# Patient Record
Sex: Male | Born: 1935 | Race: White | Hispanic: No | Smoking: Current every day smoker
Health system: Southern US, Community
[De-identification: ages and names within clinical notes are randomized; demographics above are authoritative.]

## PROBLEM LIST (undated history)

## (undated) DIAGNOSIS — F988 Other specified behavioral and emotional disorders with onset usually occurring in childhood and adolescence: Secondary | ICD-10-CM

## (undated) DIAGNOSIS — G629 Polyneuropathy, unspecified: Secondary | ICD-10-CM

## (undated) DIAGNOSIS — J449 Chronic obstructive pulmonary disease, unspecified: Secondary | ICD-10-CM

## (undated) DIAGNOSIS — F429 Obsessive-compulsive disorder, unspecified: Secondary | ICD-10-CM

## (undated) DIAGNOSIS — H9319 Tinnitus, unspecified ear: Secondary | ICD-10-CM

## (undated) DIAGNOSIS — H3530 Unspecified macular degeneration: Secondary | ICD-10-CM

## (undated) HISTORY — PX: APPENDECTOMY: SHX54

---

## 2002-08-18 ENCOUNTER — Ambulatory Visit (HOSPITAL_COMMUNITY): Admission: RE | Admit: 2002-08-18 | Discharge: 2002-08-18 | Payer: Self-pay | Admitting: *Deleted

## 2015-08-12 ENCOUNTER — Emergency Department (HOSPITAL_COMMUNITY): Payer: Medicare Other

## 2015-08-12 ENCOUNTER — Inpatient Hospital Stay (HOSPITAL_COMMUNITY)
Admission: EM | Admit: 2015-08-12 | Discharge: 2015-08-19 | DRG: 871 | Disposition: A | Discharge status: Skilled Nursing Facility | Payer: Medicare Other | Attending: Internal Medicine | Admitting: Internal Medicine

## 2015-08-12 ENCOUNTER — Encounter (HOSPITAL_COMMUNITY): Payer: Self-pay

## 2015-08-12 DIAGNOSIS — F988 Other specified behavioral and emotional disorders with onset usually occurring in childhood and adolescence: Secondary | ICD-10-CM | POA: Diagnosis present

## 2015-08-12 DIAGNOSIS — R0902 Hypoxemia: Secondary | ICD-10-CM

## 2015-08-12 DIAGNOSIS — E43 Unspecified severe protein-calorie malnutrition: Secondary | ICD-10-CM | POA: Diagnosis present

## 2015-08-12 DIAGNOSIS — R600 Localized edema: Secondary | ICD-10-CM | POA: Diagnosis not present

## 2015-08-12 DIAGNOSIS — N390 Urinary tract infection, site not specified: Secondary | ICD-10-CM | POA: Diagnosis present

## 2015-08-12 DIAGNOSIS — W19XXXA Unspecified fall, initial encounter: Secondary | ICD-10-CM | POA: Diagnosis present

## 2015-08-12 DIAGNOSIS — I5031 Acute diastolic (congestive) heart failure: Secondary | ICD-10-CM | POA: Diagnosis present

## 2015-08-12 DIAGNOSIS — F17210 Nicotine dependence, cigarettes, uncomplicated: Secondary | ICD-10-CM | POA: Diagnosis present

## 2015-08-12 DIAGNOSIS — M6282 Rhabdomyolysis: Secondary | ICD-10-CM | POA: Diagnosis present

## 2015-08-12 DIAGNOSIS — Z66 Do not resuscitate: Secondary | ICD-10-CM | POA: Diagnosis not present

## 2015-08-12 DIAGNOSIS — R740 Nonspecific elevation of levels of transaminase and lactic acid dehydrogenase [LDH]: Secondary | ICD-10-CM | POA: Diagnosis present

## 2015-08-12 DIAGNOSIS — E860 Dehydration: Secondary | ICD-10-CM | POA: Diagnosis present

## 2015-08-12 DIAGNOSIS — F429 Obsessive-compulsive disorder, unspecified: Secondary | ICD-10-CM | POA: Diagnosis present

## 2015-08-12 DIAGNOSIS — R531 Weakness: Secondary | ICD-10-CM

## 2015-08-12 DIAGNOSIS — D696 Thrombocytopenia, unspecified: Secondary | ICD-10-CM | POA: Diagnosis present

## 2015-08-12 DIAGNOSIS — J9602 Acute respiratory failure with hypercapnia: Secondary | ICD-10-CM | POA: Diagnosis not present

## 2015-08-12 DIAGNOSIS — G629 Polyneuropathy, unspecified: Secondary | ICD-10-CM | POA: Diagnosis present

## 2015-08-12 DIAGNOSIS — R748 Abnormal levels of other serum enzymes: Secondary | ICD-10-CM

## 2015-08-12 DIAGNOSIS — N179 Acute kidney failure, unspecified: Secondary | ICD-10-CM | POA: Insufficient documentation

## 2015-08-12 DIAGNOSIS — R911 Solitary pulmonary nodule: Secondary | ICD-10-CM | POA: Diagnosis present

## 2015-08-12 DIAGNOSIS — Z6820 Body mass index (BMI) 20.0-20.9, adult: Secondary | ICD-10-CM | POA: Diagnosis not present

## 2015-08-12 DIAGNOSIS — E872 Acidosis, unspecified: Secondary | ICD-10-CM

## 2015-08-12 DIAGNOSIS — J449 Chronic obstructive pulmonary disease, unspecified: Secondary | ICD-10-CM | POA: Diagnosis present

## 2015-08-12 DIAGNOSIS — R7989 Other specified abnormal findings of blood chemistry: Secondary | ICD-10-CM | POA: Diagnosis present

## 2015-08-12 DIAGNOSIS — H3530 Unspecified macular degeneration: Secondary | ICD-10-CM | POA: Diagnosis present

## 2015-08-12 DIAGNOSIS — R0602 Shortness of breath: Secondary | ICD-10-CM

## 2015-08-12 DIAGNOSIS — Z515 Encounter for palliative care: Secondary | ICD-10-CM | POA: Diagnosis not present

## 2015-08-12 DIAGNOSIS — R000 Tachycardia, unspecified: Secondary | ICD-10-CM | POA: Diagnosis present

## 2015-08-12 DIAGNOSIS — A419 Sepsis, unspecified organism: Secondary | ICD-10-CM | POA: Diagnosis present

## 2015-08-12 DIAGNOSIS — L89222 Pressure ulcer of left hip, stage 2: Secondary | ICD-10-CM | POA: Diagnosis present

## 2015-08-12 DIAGNOSIS — N289 Disorder of kidney and ureter, unspecified: Secondary | ICD-10-CM

## 2015-08-12 DIAGNOSIS — E8809 Other disorders of plasma-protein metabolism, not elsewhere classified: Secondary | ICD-10-CM | POA: Diagnosis present

## 2015-08-12 DIAGNOSIS — R627 Adult failure to thrive: Secondary | ICD-10-CM | POA: Diagnosis present

## 2015-08-12 DIAGNOSIS — L8990 Pressure ulcer of unspecified site, unspecified stage: Secondary | ICD-10-CM | POA: Diagnosis present

## 2015-08-12 DIAGNOSIS — R64 Cachexia: Secondary | ICD-10-CM | POA: Diagnosis present

## 2015-08-12 HISTORY — DX: Polyneuropathy, unspecified: G62.9

## 2015-08-12 HISTORY — DX: Chronic obstructive pulmonary disease, unspecified: J44.9

## 2015-08-12 HISTORY — DX: Unspecified macular degeneration: H35.30

## 2015-08-12 HISTORY — DX: Obsessive-compulsive disorder, unspecified: F42.9

## 2015-08-12 HISTORY — DX: Other specified behavioral and emotional disorders with onset usually occurring in childhood and adolescence: F98.8

## 2015-08-12 HISTORY — DX: Tinnitus, unspecified ear: H93.19

## 2015-08-12 LAB — CBC WITH DIFFERENTIAL/PLATELET
Basophils Absolute: 0 10*3/uL (ref 0.0–0.1)
Basophils Relative: 0 %
EOS PCT: 0 %
Eosinophils Absolute: 0 10*3/uL (ref 0.0–0.7)
HCT: 37.5 % — ABNORMAL LOW (ref 39.0–52.0)
Hemoglobin: 12.8 g/dL — ABNORMAL LOW (ref 13.0–17.0)
LYMPHS ABS: 0.7 10*3/uL (ref 0.7–4.0)
LYMPHS PCT: 3 %
MCH: 35.2 pg — AB (ref 26.0–34.0)
MCHC: 34.1 g/dL (ref 30.0–36.0)
MCV: 103 fL — AB (ref 78.0–100.0)
MONO ABS: 1.6 10*3/uL — AB (ref 0.1–1.0)
MONOS PCT: 7 %
Neutro Abs: 19.4 10*3/uL — ABNORMAL HIGH (ref 1.7–7.7)
Neutrophils Relative %: 89 %
PLATELETS: 81 10*3/uL — AB (ref 150–400)
RBC: 3.64 MIL/uL — ABNORMAL LOW (ref 4.22–5.81)
RDW: 14 % (ref 11.5–15.5)
WBC: 21.7 10*3/uL — ABNORMAL HIGH (ref 4.0–10.5)

## 2015-08-12 LAB — I-STAT CG4 LACTIC ACID, ED
LACTIC ACID, VENOUS: 3.28 mmol/L — AB (ref 0.5–2.0)
LACTIC ACID, VENOUS: 2.51 mmol/L — AB (ref 0.5–2.0)

## 2015-08-12 LAB — COMPREHENSIVE METABOLIC PANEL
ALBUMIN: 2.3 g/dL — AB (ref 3.5–5.0)
ALT: 59 U/L (ref 17–63)
AST: 232 U/L — AB (ref 15–41)
Alkaline Phosphatase: 76 U/L (ref 38–126)
Anion gap: 12 (ref 5–15)
BILIRUBIN TOTAL: 1 mg/dL (ref 0.3–1.2)
BUN: 29 mg/dL — AB (ref 6–20)
CHLORIDE: 104 mmol/L (ref 101–111)
CO2: 26 mmol/L (ref 22–32)
Calcium: 8.3 mg/dL — ABNORMAL LOW (ref 8.9–10.3)
Creatinine, Ser: 1.82 mg/dL — ABNORMAL HIGH (ref 0.61–1.24)
GFR calc Af Amer: 38 mL/min — ABNORMAL LOW (ref >60)
GFR calc non Af Amer: 33 mL/min — ABNORMAL LOW (ref >60)
GLUCOSE: 84 mg/dL (ref 65–99)
POTASSIUM: 4.4 mmol/L (ref 3.5–5.1)
Sodium: 142 mmol/L (ref 135–145)
Total Protein: 6.6 g/dL (ref 6.5–8.1)

## 2015-08-12 LAB — I-STAT TROPONIN, ED: Troponin i, poc: 0.07 ng/mL (ref 0.00–0.08)

## 2015-08-12 LAB — PROTIME-INR
INR: 1.34 (ref 0.00–1.49)
PROTHROMBIN TIME: 16.7 s — AB (ref 11.6–15.2)

## 2015-08-12 LAB — MAGNESIUM: Magnesium: 2.2 mg/dL (ref 1.7–2.4)

## 2015-08-12 LAB — CK: Total CK: 3801 U/L — ABNORMAL HIGH (ref 49–397)

## 2015-08-12 MED ORDER — SODIUM CHLORIDE 0.9 % IV SOLN
INTRAVENOUS | Status: DC
  Administered 2015-08-12 – 2015-08-15 (×5): via INTRAVENOUS

## 2015-08-12 MED ORDER — IPRATROPIUM-ALBUTEROL 0.5-2.5 (3) MG/3ML IN SOLN
3.0000 mL | Freq: Once | RESPIRATORY_TRACT | Status: AC
  Administered 2015-08-12: 3 mL via RESPIRATORY_TRACT
  Filled 2015-08-12: qty 3

## 2015-08-12 MED ORDER — HEPARIN SODIUM (PORCINE) 5000 UNIT/ML IJ SOLN
5000.0000 [IU] | Freq: Three times a day (TID) | INTRAMUSCULAR | Status: DC
  Administered 2015-08-12 – 2015-08-18 (×17): 5000 [IU] via SUBCUTANEOUS
  Filled 2015-08-12 (×19): qty 1

## 2015-08-12 MED ORDER — ACETAMINOPHEN 325 MG PO TABS
650.0000 mg | ORAL_TABLET | Freq: Four times a day (QID) | ORAL | Status: DC | PRN
  Administered 2015-08-13 – 2015-08-14 (×2): 650 mg via ORAL
  Filled 2015-08-12 (×2): qty 2

## 2015-08-12 MED ORDER — SODIUM CHLORIDE 0.9 % IV BOLUS (SEPSIS)
1000.0000 mL | Freq: Once | INTRAVENOUS | Status: AC
  Administered 2015-08-12: 1000 mL via INTRAVENOUS

## 2015-08-12 MED ORDER — ONDANSETRON HCL 4 MG PO TABS: 4.0000 mg | ORAL_TABLET | Freq: Four times a day (QID) | ORAL | Status: DC | PRN

## 2015-08-12 MED ORDER — SODIUM CHLORIDE 0.9% FLUSH
3.0000 mL | Freq: Two times a day (BID) | INTRAVENOUS | Status: DC
  Administered 2015-08-12 – 2015-08-19 (×9): 3 mL via INTRAVENOUS

## 2015-08-12 MED ORDER — VANCOMYCIN HCL 500 MG IV SOLR: 500.0000 mg | INTRAVENOUS | Status: DC

## 2015-08-12 MED ORDER — ONDANSETRON HCL 4 MG/2ML IJ SOLN: 4.0000 mg | Freq: Four times a day (QID) | INTRAMUSCULAR | Status: DC | PRN

## 2015-08-12 MED ORDER — PIPERACILLIN-TAZOBACTAM 3.375 G IVPB: 3.3750 g | Freq: Three times a day (TID) | INTRAVENOUS | Status: DC

## 2015-08-12 MED ORDER — ACETAMINOPHEN 650 MG RE SUPP: 650.0000 mg | Freq: Four times a day (QID) | RECTAL | Status: DC | PRN

## 2015-08-12 MED ORDER — PIPERACILLIN-TAZOBACTAM 3.375 G IVPB 30 MIN
3.3750 g | Freq: Once | INTRAVENOUS | Status: AC
  Administered 2015-08-12: 3.375 g via INTRAVENOUS
  Filled 2015-08-12: qty 50

## 2015-08-12 MED ORDER — MORPHINE SULFATE (PF) 2 MG/ML IV SOLN: 1.0000 mg | INTRAVENOUS | Status: DC | PRN

## 2015-08-12 MED ORDER — VANCOMYCIN HCL IN DEXTROSE 1-5 GM/200ML-% IV SOLN
1000.0000 mg | Freq: Once | INTRAVENOUS | Status: AC
  Administered 2015-08-12: 1000 mg via INTRAVENOUS
  Filled 2015-08-12: qty 200

## 2015-08-12 MED ORDER — HYDROCODONE-ACETAMINOPHEN 5-325 MG PO TABS: 1.0000 | ORAL_TABLET | ORAL | Status: DC | PRN

## 2015-08-12 NOTE — ED Notes (Signed)
Patient refused in and out cath. MD aware.

## 2015-08-12 NOTE — ED Notes (Signed)
Patient aware that urine sample is needed. Patient states he is unable to void at this time

## 2015-08-12 NOTE — ED Notes (Signed)
Patient given ginger ale and cran grape juices

## 2015-08-12 NOTE — Progress Notes (Signed)
CSW attempted to speak with patient. EDP was in at that time.  Elenore PaddyLaVonia Namiko Pritts, LCSWA [phone] ED CSW 08/12/2015 1:54 PM

## 2015-08-12 NOTE — ED Notes (Signed)
Pt arrived EMS incontinent of urine and stool. Pt clean and changed into brief

## 2015-08-12 NOTE — Progress Notes (Signed)
Pharmacy Antibiotic Note  Leodis BinetRalf E Bryngelson is a 82 y.o. male admitted on 08/12/2015 with sepsis.  Pharmacy has been consulted for vancomycin/Zosyn dosing.   Patient lives at home alone and states that about 3 days ago he was trying to get out of his chair and fell onto the ground. He states that he was unable to get up. Family states that they had not heard from him over this period of time and subsequently called the police who then found patient lying on the floor.   Plan: Zosyn 3.375g IV q8h (4 hour infusion).  Vancomycin 1000 mg IV x1, then 500 mg IV q24h.  Height: 5\' 7"  (170.2 cm) Weight: 130 lb (58.968 kg) IBW/kg (Calculated) : 66.1  Temp (24hrs), Avg:97.4 F (36.3 C), Min:97.4 F (36.3 C), Max:97.4 F (36.3 C)   Recent Labs Lab 08/12/15 1322 08/12/15 1334  WBC 21.7*  --   CREATININE 1.82*  --   LATICACIDVEN  --  3.28*    Estimated Creatinine Clearance: 26.1 mL/min (by C-G formula based on Cr of 1.82).    No Known Allergies  Antimicrobials this admission: ----  Dose adjustments this admission: ----  Microbiology results: ----  Thank you for allowing pharmacy to be a part of this patient's care.   Adalberto ColeNikola Troi Florendo, PharmD, BCPS Pager [phone] 08/12/2015 3:44 PM

## 2015-08-12 NOTE — H&P (Signed)
[facility] History and Physical  KERN GINGRAS ZOX:[id number] DOB: 12/16/1931 DOA: 08/12/2015  Referring physician: Verdie Mosher PCP: No primary care provider on file.   Chief Complaint: Fall  HPI: Jordan Santiago is a 82 y.o. male with past medical history of COPD and ADD, brought to the hospital after a fall. Patient reported for the past year he had lower extremity edema, but recently he developed generalized weakness and his overall health was going downhill. He reported on Saturday (his daughter mentioned that was just 2 days ago) he slid out of his chair and he remained on the floor until she tried to call him earlier today, he did not call back so she stopped by and he was found to be on the floor, covered by his waste. He mentioned to his daughter that he couldn't get up after he fell. In the ED his creatinine was found to be at 1.82, lactic acid 3.28, WBC 21.7 and platelets 81. Chest x-ray without acute findings, his urinalysis pending, patient refused in and out catheterization.  Review of Systems:  Constitutional: Generalized weakness Eyes: negative for irritation, redness and visual disturbance Ears, nose, mouth, throat, and face: negative for earaches, epistaxis, nasal congestion and sore throat Respiratory: negative for cough, dyspnea on exertion, sputum and wheezing Cardiovascular: negative for chest pain, dyspnea, lower extremity edema, orthopnea, palpitations and syncope Gastrointestinal: negative for abdominal pain, constipation, diarrhea, melena, nausea and vomiting Genitourinary:negative for dysuria, frequency and hematuria Hematologic/lymphatic: negative for bleeding, easy bruising and lymphadenopathy Musculoskeletal:negative for arthralgias, muscle weakness and stiff joints Neurological: negative for coordination problems, gait problems, headaches and weakness Endocrine: negative for diabetic symptoms including polydipsia, polyuria and weight  loss Allergic/Immunologic: negative for anaphylaxis, hay fever and urticaria  Past Medical History  Diagnosis Date  . AMD (age related macular degeneration)   . COPD (chronic obstructive pulmonary disease) (HCC)   . Peripheral neuropathy (HCC)   . Tinnitus   . ADD (attention deficit disorder)   . OCD (obsessive compulsive disorder)    Past Surgical History  Procedure Laterality Date  . Appendectomy     Social History:   reports that he has been smoking Cigarettes.  He has been smoking about 0.50 packs per day. He does not have any smokeless tobacco history on file. He reports that he drinks alcohol. He reports that he does not use illicit drugs.  No Known Allergies  Family history No pertinent family history  Prior to Admission medications   Medication Sig Start Date End Date Taking? Authorizing Provider  Multiple Vitamins-Minerals (B COMPLEX PLUS VITAMIN C PO) Take 1 tablet by mouth daily.   Yes Historical Provider, MD  Multiple Vitamins-Minerals (OCUVITE PRESERVISION PO) Take 2 tablets by mouth daily.   Yes Historical Provider, MD   Physical Exam: Filed Vitals:   08/12/15 1405 08/12/15 1430  BP: 130/77 107/80  Pulse: 117 110  Temp:    Resp: 18 21   Constitutional: Oriented to person, place, and time. Well-developed and well-nourished. Cooperative.  Head: Normocephalic and atraumatic.  Nose: Nose normal.  Mouth/Throat: Uvula is midline, oropharynx is clear and moist and mucous membranes are normal.  Eyes: Conjunctivae and EOM are normal. Pupils are equal, round, and reactive to light.  Neck: Trachea normal and normal range of motion. Neck supple.  Cardiovascular: Normal rate, regular rhythm, S1 normal, S2 normal, normal heart sounds and intact distal pulses.   Pulmonary/Chest: Effort normal and breath sounds normal.  Abdominal: Soft. Bowel sounds are normal. There is no  hepatosplenomegaly. There is no tenderness.  Musculoskeletal: Normal range of motion.  Neurological:  Alert and oriented to person, place, and time. Has normal strength. No cranial nerve deficit or sensory deficit.  Skin: Skin is warm, dry and intact.  Psychiatric: Has a normal mood and affect. Speech is normal and behavior is normal.   Labs on Admission:  Basic Metabolic Panel:  Recent Labs Lab 08/12/15 1322 08/12/15 1325  NA 142  --   K 4.4  --   CL 104  --   CO2 26  --   GLUCOSE 84  --   BUN 29*  --   CREATININE 1.82*  --   CALCIUM 8.3*  --   MG  --  2.2   Liver Function Tests:  Recent Labs Lab 08/12/15 1322  AST 232*  ALT 59  ALKPHOS 76  BILITOT 1.0  PROT 6.6  ALBUMIN 2.3*   No results for input(s): LIPASE, AMYLASE in the last 168 hours. No results for input(s): AMMONIA in the last 168 hours. CBC:  Recent Labs Lab 08/12/15 1322  WBC 21.7*  NEUTROABS 19.4*  HGB 12.8*  HCT 37.5*  MCV 103.0*  PLT 81*   Cardiac Enzymes:  Recent Labs Lab 08/12/15 1322  CKTOTAL 3801*    BNP (last 3 results) No results for input(s): BNP in the last 8760 hours.  ProBNP (last 3 results) No results for input(s): PROBNP in the last 8760 hours.  CBG: No results for input(s): GLUCAP in the last 168 hours.  Radiological Exams on Admission: Dg Chest 2 View  08/12/2015  CLINICAL DATA:  Weakness and fall. EXAM: CHEST  2 VIEW COMPARISON:  None. FINDINGS: COPD with emphysematous changes and hyperinflation. There is trace pleural fluid bilaterally. Nodular opacity of the right mid lung measuring ~20 mm. Nipple shadow over the left base. There is no edema, consolidation, or pneumothorax. Normal heart size and mediastinal contours. IMPRESSION: 1. Emphysema with right-sided nodule. Recommend chest CT, contrast not essential, when clinically appropriate. 2. Trace pleural effusions. Electronically Signed   By: Marnee SpringJonathon  Watts M.D.   On: 08/12/2015 13:52   Ct Head Wo Contrast  08/12/2015  CLINICAL DATA:  82 year old post fall three days ago while getting out of chair. EXAM: CT HEAD  WITHOUT CONTRAST CT CERVICAL SPINE WITHOUT CONTRAST TECHNIQUE: Multidetector CT imaging of the head and cervical spine was performed following the standard protocol without intravenous contrast. Multiplanar CT image reconstructions of the cervical spine were also generated. COMPARISON:  None. FINDINGS: CT HEAD FINDINGS Ventricles and cisterns are within normal. There is minimal prominence of the CSF spaces compatible with age related atrophy. There is chronic ischemic microvascular disease. There is no mass, mass effect, shift of midline structures or acute hemorrhage. There is no evidence to suggest acute infarction. Remaining bones and soft tissues are within normal. CT CERVICAL SPINE FINDINGS There is straightening of the normal cervical lordosis with subtle 1-2 mm anterior subluxation of C3 on C4 likely degenerative. Mild to moderate spondylosis of the cervical spine. There is moderate disc space narrowing at the C5-6 and C6-7 levels and to lesser extent at the C4-5 level. Prevertebral soft tissues are within normal. There is moderate uncovertebral joint spurring and mild facet arthropathy. The atlantoaxial articulation is within normal mild neural foraminal narrowing at several levels of the mid to lower cervical spine. No acute fracture or traumatic subluxation. Emphysematous disease over the lung apices. Remainder of the exam is within normal. IMPRESSION: No acute intracranial findings per Mild  atrophy and chronic ischemic microvascular disease. No acute cervical spine injury. Mild to moderate spondylosis of the cervical spinal multilevel disc disease from the C4-5 level to the C6-7 level. Mild multilevel neural foraminal narrowing. Emphysematous disease. Electronically Signed   By: Elberta Fortis M.D.   On: 08/12/2015 14:16   Ct Cervical Spine Wo Contrast  08/12/2015  CLINICAL DATA:  82 year old post fall three days ago while getting out of chair. EXAM: CT HEAD WITHOUT CONTRAST CT CERVICAL SPINE WITHOUT  CONTRAST TECHNIQUE: Multidetector CT imaging of the head and cervical spine was performed following the standard protocol without intravenous contrast. Multiplanar CT image reconstructions of the cervical spine were also generated. COMPARISON:  None. FINDINGS: CT HEAD FINDINGS Ventricles and cisterns are within normal. There is minimal prominence of the CSF spaces compatible with age related atrophy. There is chronic ischemic microvascular disease. There is no mass, mass effect, shift of midline structures or acute hemorrhage. There is no evidence to suggest acute infarction. Remaining bones and soft tissues are within normal. CT CERVICAL SPINE FINDINGS There is straightening of the normal cervical lordosis with subtle 1-2 mm anterior subluxation of C3 on C4 likely degenerative. Mild to moderate spondylosis of the cervical spine. There is moderate disc space narrowing at the C5-6 and C6-7 levels and to lesser extent at the C4-5 level. Prevertebral soft tissues are within normal. There is moderate uncovertebral joint spurring and mild facet arthropathy. The atlantoaxial articulation is within normal mild neural foraminal narrowing at several levels of the mid to lower cervical spine. No acute fracture or traumatic subluxation. Emphysematous disease over the lung apices. Remainder of the exam is within normal. IMPRESSION: No acute intracranial findings per Mild atrophy and chronic ischemic microvascular disease. No acute cervical spine injury. Mild to moderate spondylosis of the cervical spinal multilevel disc disease from the C4-5 level to the C6-7 level. Mild multilevel neural foraminal narrowing. Emphysematous disease. Electronically Signed   By: Elberta Fortis M.D.   On: 08/12/2015 14:16    EKG: Independently reviewed.   Assessment/Plan Principal Problem:   Fall Active Problems:   Rhabdomyolysis   Renal insufficiency   Bilateral lower extremity edema   Leukocytosis   Elevated lactic acid level    Thrombocytopenia (HCC)   Generalized weakness    Fall Patient fell a couple of days ago and was not able to stand up and been on the floor since then. Generalized weakness, no evidence of trauma or fractures. Evidence of rhabdomyolysis likely from being on the floor. Patient was awake and alert all the time no evidence of loss of consciousness. Patient will be on telemetry, hydrate patient with IV fluids.  Rhabdomyolysis Mild rhabdomyolysis with CK of 3800. Patient has acute kidney injury not sure if it's acute or secondary to the rhabdomyolysis. Hydrate with IV fluids, check CK in the morning.  Renal sufficiency Creatinine is 1.82, unknown baseline. As mentioned above unclear to me of this is acute or chronic renal insufficiency, hydrate with IV fluids and check BMP in a.m.  Elevated lactic acid This is likely secondary to dehydration is no evidence of infection so far, no fever. Leukocytosis is likely secondary to stress demargination.  Thrombocytopenia Asymptomatic, follow  Bilateral lower extremity edema With chronic skin changes, mentioned he has edema for the past year. No history of CKD or CHF, we'll hydrate carefully.  Code Status: Full code Family Communication: Plan discussed with the patient in the presence of his daughter at bedside. Disposition Plan: Inpatient  Time spent:  70 minutes  Clint Lipps, MD [facility] Pager [phone]

## 2015-08-12 NOTE — ED Notes (Addendum)
Per EMS, police were called by family because they had not heard from the patient in a 3 days. Patient was found laying on the floor beside the bed, unable to recall how long he had been laying there. Skin tear to left elbow Upon arrival, patient had a slight wheeze. Patient given 5 mg of albuterol. Patient's lungs are now clear. Patient complaining generalized lower back pain. Stage 1-2 pressure ulcer to left hip area. Patient is from home and per EMS, patient's living conditions are not well.

## 2015-08-12 NOTE — ED Notes (Signed)
Pt reports that he slid out of a chair x 3 days ago.  Sts "I slid out of the chair.  I have a bony butt and the seat ejected me.  I don't know why I couldn't get up.  I don't know if it was physical or psychological."  Denies new pain.

## 2015-08-12 NOTE — ED Notes (Signed)
Patient transported to CT and X ray 

## 2015-08-12 NOTE — Progress Notes (Signed)
CSW spoke with patient's daughter, Earlean ShawlLee Ennis, daughter to obtain information. Patient was speaking with Pharmacy Tech at the time. Patient's daughter reports she went to see patient today. She reports she could hear him, however, she could not get the door open. She reports she went to the office at the complex where patient resides to see if they would allow her in his place. She reports she is not on patient's lease, therefore, they could not allow her in his place. She reports she called the police and they found patient on the floor. She reports patient is very weak, however, she states no history of falls.   Patient's daughter reports patient does live alone and he has a cane. She stated she did not want to see patient go into a skilled nursing facility. She stated she would like for him to go into an assisted living facility. She stated she [place] in [place], [place] and she would like for patient to be placed at a facility in [place] area, such [place]: [place]. CSW informed patient's daughter that she could be provided resources for this area to assist her with placement.   Patient's daughter reports that the patient only has Medicare. CSW informed [place] [facility] [place] [facility] [place] to speak with a Medicaid worker to begin the application process for Medicaid. Patient is a disable veteran and he states he receives $1496 monthly. No further questions noted at this time.   Earlean ShawlLee Ennis, daughter [phone]

## 2015-08-12 NOTE — ED Provider Notes (Signed)
CSN: [id number]     Arrival date & time 08/12/15  1223 History   First MD Initiated Contact with Patient 08/12/15 1248     Chief Complaint  Patient presents with  . Fall     (Consider location/radiation/quality/duration/timing/severity/associated sxs/prior Treatment) HPI 82 year old male who presents after fall. History of COPD. History provided by patient and his daughter. Patient lives at home alone and states that about 3 days ago he was trying to get out of his chair and fell onto the ground. He states that he was unable to get up. Family states that they had not heard from him over this period of time and subsequently called the police who then found patient lying on the floor. Patient currently denying any pain. Denies headache, chest pain, difficulty breathing, abdominal pain. He does not think he has recently been sick but does state that he has been generally weak. Past Medical History  Diagnosis Date  . AMD (age related macular degeneration)   . COPD (chronic obstructive pulmonary disease) (HCC)   . Peripheral neuropathy (HCC)   . Tinnitus   . ADD (attention deficit disorder)   . OCD (obsessive compulsive disorder)    Past Surgical History  Procedure Laterality Date  . Appendectomy     History reviewed. No pertinent family history. Social History  Substance Use Topics  . Smoking status: Current Every Day Smoker -- 0.50 packs/day    Types: Cigarettes  . Smokeless tobacco: None  . Alcohol Use: Yes    Review of Systems 10/14 systems reviewed and are negative other than those stated in the HPI   Allergies  Review of patient's allergies indicates no known allergies.  Home Medications   Prior to Admission medications   Medication Sig Start Date End Date Taking? Authorizing Provider  Multiple Vitamins-Minerals (B COMPLEX PLUS VITAMIN C PO) Take 1 tablet by mouth daily.   Yes Historical Provider, MD  Multiple Vitamins-Minerals (OCUVITE PRESERVISION PO) Take 2 tablets  by mouth daily.   Yes Historical Provider, MD   BP 107/80 mmHg  Pulse 110  Temp(Src) 97.4 F (36.3 C) (Oral)  Resp 21  Ht 5\' 7"  (1.702 m)  Wt 130 lb (58.968 kg)  BMI 20.36 kg/m2  SpO2 90% Physical Exam Physical Exam  Nursing note and vitals reviewed. Constitutional: Cachectic appearing elderly gentleman, non-toxic, and in no acute distress Head: Normocephalic and atraumatic.  Mouth/Throat: Oropharynx is clear. Poor dentition with dry mucous membranes Neck: Normal range of motion. Neck supple.  Cardiovascular: Tachycardic rate and regular rhythm.  +2 pitting edema. Pulmonary/Chest: Effort normal. Poor air movement but no appreciable wheezes, rales or rhonchi. Abdominal: Soft. There is no tenderness. There is no rebound and no guarding. no flank tenderness Musculoskeletal: Normal range of motion. No CTLS spine tenderness. Neurological: Alert, no facial droop, fluent speech, moves all extremities symmetrically Skin: Skin is warm and dry. Poor skin turgor Psychiatric: Cooperative  ED Course  Procedures (including critical care time) Labs Review Labs Reviewed  COMPREHENSIVE METABOLIC PANEL - Abnormal; Notable for the following:    BUN 29 (*)    Creatinine, Ser 1.82 (*)    Calcium 8.3 (*)    Albumin 2.3 (*)    AST 232 (*)    GFR calc non Af Amer 33 (*)    GFR calc Af Amer 38 (*)    All other components within normal limits  CBC WITH DIFFERENTIAL/PLATELET - Abnormal; Notable for the following:    WBC 21.7 (*)    RBC  3.64 (*)    Hemoglobin 12.8 (*)    HCT 37.5 (*)    MCV 103.0 (*)    MCH 35.2 (*)    Platelets 81 (*)    Neutro Abs 19.4 (*)    Monocytes Absolute 1.6 (*)    All other components within normal limits  CK - Abnormal; Notable for the following:    Total CK 3801 (*)    All other components within normal limits  I-STAT CG4 LACTIC ACID, ED - Abnormal; Notable for the following:    Lactic Acid, Venous 3.28 (*)    All other components within normal limits  I-STAT  CG4 LACTIC ACID, ED - Abnormal; Notable for the following:    Lactic Acid, Venous 2.51 (*)    All other components within normal limits  URINE CULTURE  CULTURE, BLOOD (ROUTINE X 2)  CULTURE, BLOOD (ROUTINE X 2)  MAGNESIUM  URINALYSIS, ROUTINE W REFLEX MICROSCOPIC (NOT AT [facility])  Rosezena SensorI-STAT TROPOININ, ED    Imaging Review Dg Chest 2 View  08/12/2015  CLINICAL DATA:  Weakness and fall. EXAM: CHEST  2 VIEW COMPARISON:  None. FINDINGS: COPD with emphysematous changes and hyperinflation. There is trace pleural fluid bilaterally. Nodular opacity of the right mid lung measuring ~20 mm. Nipple shadow over the left base. There is no edema, consolidation, or pneumothorax. Normal heart size and mediastinal contours. IMPRESSION: 1. Emphysema with right-sided nodule. Recommend chest CT, contrast not essential, when clinically appropriate. 2. Trace pleural effusions. Electronically Signed   By: Marnee SpringJonathon  Watts M.D.   On: 08/12/2015 13:52   Ct Head Wo Contrast  08/12/2015  CLINICAL DATA:  82 year old post fall three days ago while getting out of chair. EXAM: CT HEAD WITHOUT CONTRAST CT CERVICAL SPINE WITHOUT CONTRAST TECHNIQUE: Multidetector CT imaging of the head and cervical spine was performed following the standard protocol without intravenous contrast. Multiplanar CT image reconstructions of the cervical spine were also generated. COMPARISON:  None. FINDINGS: CT HEAD FINDINGS Ventricles and cisterns are within normal. There is minimal prominence of the CSF spaces compatible with age related atrophy. There is chronic ischemic microvascular disease. There is no mass, mass effect, shift of midline structures or acute hemorrhage. There is no evidence to suggest acute infarction. Remaining bones and soft tissues are within normal. CT CERVICAL SPINE FINDINGS There is straightening of the normal cervical lordosis with subtle 1-2 mm anterior subluxation of C3 on C4 likely degenerative. Mild to moderate spondylosis of the  cervical spine. There is moderate disc space narrowing at the C5-6 and C6-7 levels and to lesser extent at the C4-5 level. Prevertebral soft tissues are within normal. There is moderate uncovertebral joint spurring and mild facet arthropathy. The atlantoaxial articulation is within normal mild neural foraminal narrowing at several levels of the mid to lower cervical spine. No acute fracture or traumatic subluxation. Emphysematous disease over the lung apices. Remainder of the exam is within normal. IMPRESSION: No acute intracranial findings per Mild atrophy and chronic ischemic microvascular disease. No acute cervical spine injury. Mild to moderate spondylosis of the cervical spinal multilevel disc disease from the C4-5 level to the C6-7 level. Mild multilevel neural foraminal narrowing. Emphysematous disease. Electronically Signed   By: Elberta Fortisaniel  Boyle M.D.   On: 08/12/2015 14:16   Ct Cervical Spine Wo Contrast  08/12/2015  CLINICAL DATA:  82 year old post fall three days ago while getting out of chair. EXAM: CT HEAD WITHOUT CONTRAST CT CERVICAL SPINE WITHOUT CONTRAST TECHNIQUE: Multidetector CT imaging of the head  and cervical spine was performed following the standard protocol without intravenous contrast. Multiplanar CT image reconstructions of the cervical spine were also generated. COMPARISON:  None. FINDINGS: CT HEAD FINDINGS Ventricles and cisterns are within normal. There is minimal prominence of the CSF spaces compatible with age related atrophy. There is chronic ischemic microvascular disease. There is no mass, mass effect, shift of midline structures or acute hemorrhage. There is no evidence to suggest acute infarction. Remaining bones and soft tissues are within normal. CT CERVICAL SPINE FINDINGS There is straightening of the normal cervical lordosis with subtle 1-2 mm anterior subluxation of C3 on C4 likely degenerative. Mild to moderate spondylosis of the cervical spine. There is moderate disc space  narrowing at the C5-6 and C6-7 levels and to lesser extent at the C4-5 level. Prevertebral soft tissues are within normal. There is moderate uncovertebral joint spurring and mild facet arthropathy. The atlantoaxial articulation is within normal mild neural foraminal narrowing at several levels of the mid to lower cervical spine. No acute fracture or traumatic subluxation. Emphysematous disease over the lung apices. Remainder of the exam is within normal. IMPRESSION: No acute intracranial findings per Mild atrophy and chronic ischemic microvascular disease. No acute cervical spine injury. Mild to moderate spondylosis of the cervical spinal multilevel disc disease from the C4-5 level to the C6-7 level. Mild multilevel neural foraminal narrowing. Emphysematous disease. Electronically Signed   By: Elberta Fortis M.D.   On: 08/12/2015 14:16   I have personally reviewed and evaluated these images and lab results as part of my medical decision-making.   EKG Interpretation   Date/Time:  Thursday August 12 2015 13:09:48 EDT Ventricular Rate:  120 PR Interval:    QRS Duration: 77 QT Interval:  399 QTC Calculation: 564 R Axis:   -81 Text Interpretation:  Atrial fibrillation Ventricular premature complex  Consider inferior infarct Anterolateral infarct, age indeterminate  Prolonged QT interval Baseline wander in lead(s) V3 Confirmed by LIU MD,  DANA [phone]) on 08/12/2015 3:10:06 PM     CRITICAL CARE Performed by: Lavera Guise   Total critical care time: 35 minutes  Critical care time was exclusive of separately billable procedures and treating other patients.  Critical care was necessary to treat or prevent imminent or life-threatening deterioration.  Critical care was time spent personally by me on the following activities: development of treatment plan with patient and/or surrogate as well as nursing, discussions with consultants, evaluation of patient's response to treatment, examination of  patient, obtaining history from patient or surrogate, ordering and performing treatments and interventions, ordering and review of laboratory studies, ordering and review of radiographic studies, pulse oximetry and re-evaluation of patient's condition.  MDM   Final diagnoses:  Dehydration  AKI (acute kidney injury) (HCC)  Lactic acidosis  Elevated CK   82 year old male who presents after fall. He is cachectic and chronically ill-appearing on presentation, but is in no acute distress, answering questions appropriately and grossly neurologically intact. Tachycardic with a heart rate in the 100s to 110s, afebrile and normotensive. No significant signs of severe trauma on exam. CT head and cervical spine are negative for acute traumatic injuries. Blood work concerning for dehydration and mild rhabdomyolysis. CK 3800 with AKI. Leukocytosis and elevated lactic acid as well, which is suspect is more reactive and 2/2 to lying on ground x 3 days. Did obtain blood cultures and urine cultures empirically treated with vancomycin and Zosyn given that he does have some SIRS criteria with unclear source. Pending UA as  patient refusing straight catheterization and unable to urinate. Received 2L of IVF with downtrending lactic acid. Admitted to hospitalist service for ongoing management.     Lavera Guise, MD 08/12/15 [phone]

## 2015-08-12 NOTE — ED Notes (Signed)
Hospitalist at bedside 

## 2015-08-12 NOTE — ED Notes (Signed)
RN Mel AlmondJada and Dr Verdie MosherLiu notified of Lac of 3.28

## 2015-08-13 ENCOUNTER — Inpatient Hospital Stay (HOSPITAL_COMMUNITY): Payer: Medicare Other

## 2015-08-13 DIAGNOSIS — L8990 Pressure ulcer of unspecified site, unspecified stage: Secondary | ICD-10-CM

## 2015-08-13 DIAGNOSIS — J9602 Acute respiratory failure with hypercapnia: Secondary | ICD-10-CM

## 2015-08-13 DIAGNOSIS — N3001 Acute cystitis with hematuria: Secondary | ICD-10-CM

## 2015-08-13 DIAGNOSIS — M6282 Rhabdomyolysis: Secondary | ICD-10-CM

## 2015-08-13 DIAGNOSIS — D696 Thrombocytopenia, unspecified: Secondary | ICD-10-CM

## 2015-08-13 DIAGNOSIS — N289 Disorder of kidney and ureter, unspecified: Secondary | ICD-10-CM

## 2015-08-13 DIAGNOSIS — E43 Unspecified severe protein-calorie malnutrition: Secondary | ICD-10-CM

## 2015-08-13 LAB — BASIC METABOLIC PANEL
Anion gap: 9 (ref 5–15)
BUN: 34 mg/dL — AB (ref 6–20)
CALCIUM: 7.5 mg/dL — AB (ref 8.9–10.3)
CHLORIDE: 111 mmol/L (ref 101–111)
CO2: 26 mmol/L (ref 22–32)
CREATININE: 1.8 mg/dL — AB (ref 0.61–1.24)
GFR calc non Af Amer: 33 mL/min — ABNORMAL LOW (ref >60)
GFR, EST AFRICAN AMERICAN: 39 mL/min — AB (ref >60)
GLUCOSE: 88 mg/dL (ref 65–99)
Potassium: 3.9 mmol/L (ref 3.5–5.1)
Sodium: 146 mmol/L — ABNORMAL HIGH (ref 135–145)

## 2015-08-13 LAB — CBC
HCT: 33.1 % — ABNORMAL LOW (ref 39.0–52.0)
Hemoglobin: 11 g/dL — ABNORMAL LOW (ref 13.0–17.0)
MCH: 35.4 pg — AB (ref 26.0–34.0)
MCHC: 33.2 g/dL (ref 30.0–36.0)
MCV: 106.4 fL — AB (ref 78.0–100.0)
PLATELETS: 110 10*3/uL — AB (ref 150–400)
RBC: 3.11 MIL/uL — ABNORMAL LOW (ref 4.22–5.81)
RDW: 14.4 % (ref 11.5–15.5)
WBC: 23.5 10*3/uL — ABNORMAL HIGH (ref 4.0–10.5)

## 2015-08-13 LAB — URINALYSIS, ROUTINE W REFLEX MICROSCOPIC
Glucose, UA: NEGATIVE mg/dL
Ketones, ur: NEGATIVE mg/dL
Nitrite: NEGATIVE
PROTEIN: 30 mg/dL — AB
SPECIFIC GRAVITY, URINE: 1.025 (ref 1.005–1.030)
pH: 5.5 (ref 5.0–8.0)

## 2015-08-13 LAB — ECHOCARDIOGRAM COMPLETE
HEIGHTINCHES: 67 in
WEIGHTICAEL: 2080 [oz_av]

## 2015-08-13 LAB — HEMOGLOBIN A1C
HEMOGLOBIN A1C: 4.8 % (ref 4.8–5.6)
Mean Plasma Glucose: 91 mg/dL

## 2015-08-13 LAB — URINE MICROSCOPIC-ADD ON

## 2015-08-13 LAB — CK: CK TOTAL: 919 U/L — AB (ref 49–397)

## 2015-08-13 LAB — TSH: TSH: 2.033 u[IU]/mL (ref 0.350–4.500)

## 2015-08-13 MED ORDER — SODIUM CHLORIDE 0.9 % IV BOLUS (SEPSIS)
250.0000 mL | Freq: Once | INTRAVENOUS | Status: AC
  Administered 2015-08-13: 250 mL via INTRAVENOUS

## 2015-08-13 MED ORDER — ALBUTEROL SULFATE (2.5 MG/3ML) 0.083% IN NEBU
2.5000 mg | INHALATION_SOLUTION | RESPIRATORY_TRACT | Status: DC | PRN
  Administered 2015-08-15: 2.5 mg via RESPIRATORY_TRACT
  Filled 2015-08-13: qty 3

## 2015-08-13 MED ORDER — SODIUM CHLORIDE 0.9 % IV BOLUS (SEPSIS)
500.0000 mL | Freq: Once | INTRAVENOUS | Status: AC
  Administered 2015-08-13: 500 mL via INTRAVENOUS

## 2015-08-13 MED ORDER — METOPROLOL TARTRATE 1 MG/ML IV SOLN: 2.5000 mg | Freq: Once | INTRAVENOUS | Status: DC

## 2015-08-13 MED ORDER — IPRATROPIUM-ALBUTEROL 0.5-2.5 (3) MG/3ML IN SOLN
3.0000 mL | Freq: Three times a day (TID) | RESPIRATORY_TRACT | Status: DC
  Administered 2015-08-14 – 2015-08-17 (×11): 3 mL via RESPIRATORY_TRACT
  Filled 2015-08-13 (×11): qty 3

## 2015-08-13 MED ORDER — IPRATROPIUM BROMIDE 0.02 % IN SOLN
0.5000 mg | Freq: Four times a day (QID) | RESPIRATORY_TRACT | Status: DC
  Administered 2015-08-13 (×2): 0.5 mg via RESPIRATORY_TRACT
  Filled 2015-08-13 (×2): qty 2.5

## 2015-08-13 MED ORDER — ENSURE ENLIVE PO LIQD
237.0000 mL | Freq: Three times a day (TID) | ORAL | Status: DC
  Administered 2015-08-13 – 2015-08-15 (×2): 237 mL via ORAL

## 2015-08-13 MED ORDER — PNEUMOCOCCAL VAC POLYVALENT 25 MCG/0.5ML IJ INJ
0.5000 mL | INJECTION | INTRAMUSCULAR | Status: DC
  Filled 2015-08-13 (×2): qty 0.5

## 2015-08-13 MED ORDER — DEXTROSE 5 % IV SOLN
1.0000 g | INTRAVENOUS | Status: DC
  Administered 2015-08-13 – 2015-08-17 (×5): 1 g via INTRAVENOUS
  Filled 2015-08-13 (×6): qty 10

## 2015-08-13 NOTE — Evaluation (Signed)
Physical Therapy Evaluation Patient Details Name: Jordan BinetRalf E Santiago MRN: [id number] DOB: [birth year] Today's Date: 08/13/2015   History of Present Illness  Jordan Santiago is a 82 y.o. male brought to the hospital after a fall. Patient reported for the past year he had lower extremity edema, but recently he developed generalized weakness and his overall health was going downhill. PMHx: AMD , ADD, COPD,tinnitus, macular degeneration,OCD, and peripherial neuropathy  Clinical Impression  On eval, pt required Max +2 assist and use of STEDY for standing, transfer from recliner to bed. Max multimodal cueing required during session. Pt is limited by pain, weakness. Recommend ST rehab at SNF.     Follow Up Recommendations SNF    Equipment Recommendations  None recommended by PT    Recommendations for Other Services       Precautions / Restrictions Precautions Precautions: Fall Restrictions Weight Bearing Restrictions: No      Mobility  Bed Mobility Overal bed mobility: Needs Assistance Bed Mobility: Sit to Supine     Supine to sit: Total assist;HOB elevated Sit to supine: +2 for safety/equipment;+2 for physical assistance;Max assist   General bed mobility comments: and use of bed pad with HOB all the way up  Transfers Overall transfer level: Needs assistance   Transfers: Sit to/from Stand;Stand Pivot Transfers Sit to Stand: Max assist;+2 physical assistance;+2 safety/equipment Stand pivot transfers: Mod assist;From elevated surface       General transfer comment: Attemtped sit to stand with RW-pt unable with +1 assist. used STEDY and +2 assist to stand/sit.  Ambulation/Gait             General Gait Details: NT-pt unable  Stairs            Wheelchair Mobility    Modified Rankin (Stroke Patients Only)       Balance Overall balance assessment: Needs assistance Sitting-balance support: Feet supported;No upper extremity supported Sitting balance-Leahy  Scale: Poor Sitting balance - Comments: requires someone close by due to tendency to lean posteriorly intermittently   Standing balance support: Bilateral upper extremity supported Standing balance-Leahy Scale: Poor                               Pertinent Vitals/Pain Pain Assessment: Faces Faces Pain Scale: Hurts even more Pain Location: bil LEs, bil UEs with movement Pain Descriptors / Indicators: Grimacing;Guarding;Moaning Pain Intervention(s): Limited activity within patient's tolerance;Repositioned    Home Living Family/patient expects to be discharged to:: Skilled nursing facility Living Arrangements: Alone                    Prior Function Level of Independence: Independent with assistive device(s)         Comments: Used A SPC at home per his daughter in room. Did not cook, just ate out of the refrigerator.     Hand Dominance   Dominant Hand: Right    Extremity/Trunk Assessment   Upper Extremity Assessment: Defer to OT evaluation           Lower Extremity Assessment: Generalized weakness         Communication   Communication:  (dysarthric at times)  Cognition Arousal/Alertness: Awake/alert Behavior During Therapy: Flat affect Overall Cognitive Status: Impaired/Different from baseline Area of Impairment: Following commands;Problem solving       Following Commands: Follows one step commands inconsistently     Problem Solving: Slow processing;Decreased initiation;Requires verbal cues;Requires tactile cues;Difficulty sequencing  General Comments      Exercises General Exercises - Lower Extremity Long Arc Quad: AAROM;Both;5 reps;Seated      Assessment/Plan    PT Assessment Patient needs continued PT services  PT Diagnosis Difficulty walking;Generalized weakness;Acute pain   PT Problem List Decreased strength;Decreased activity tolerance;Decreased balance;Decreased mobility;Decreased knowledge of use of  DME;Pain;Decreased cognition  PT Treatment Interventions DME instruction;Gait training;Functional mobility training;Therapeutic activities;Patient/family education;Balance training;Therapeutic exercise   PT Goals (Current goals can be found in the Care Plan section) Acute Rehab PT Goals Patient Stated Goal: daughter (to SNF v. ALF) I explained the differences to her and why I felt like he needed SNF at this time PT Goal Formulation: With family Time For Goal Achievement: 08/27/15 Potential to Achieve Goals: Fair    Frequency Min 3X/week   Barriers to discharge        Co-evaluation               End of Session Equipment Utilized During Treatment: Gait belt Activity Tolerance: Patient limited by fatigue;Patient limited by pain Patient left: in bed;with call bell/phone within reach;with nursing/sitter in room;with family/visitor present           Time: 1610-9604 PT Time Calculation (min) (ACUTE ONLY): 33 min   Charges:   PT Evaluation $PT Eval Low Complexity: 1 Procedure PT Treatments $Therapeutic Activity: 23-37 mins   PT G Codes:        Rebeca Alert, MPT Pager: [phone]

## 2015-08-13 NOTE — Progress Notes (Signed)
   08/13/15 1500  Clinical Encounter Type  Visited With Patient and family together  Visit Type Initial;Psychological support;Spiritual support;Other (Comment)  Referral From Family  Consult/Referral To Chaplain  Spiritual Encounters  Spiritual Needs Other (Comment);Emotional (Advance Directive )  Stress Factors  Patient Stress Factors Health changes;Major life changes;Other (Comment)  Family Stress Factors Health changes;Major life changes  Advance Directives (For Healthcare)  Does patient have an advance directive? Yes  Type of Advance Directive Healthcare Power of Attorney  Does patient want to make changes to advanced directive? Yes - information given  Copy of advanced directive(s) in chart? Yes    I was referred to the patient by the Social Worker who stated that the patient was ready to complete his ProofreaderAdvance Directive.  The patient and I went over the material; and he understood everything clearly before we proceeded. The patient wanted to make his daughter his primary healthcare power of attorney and his son his secondary healthcare power of attorney. The patient did not wish to complete a Living Will.  A notary and two witnesses were obtained and the document was completed.   Please contact Spiritual Care for further assistance.   Chaplain Clint BolderBrittany Arnecia Ector M.Div.

## 2015-08-13 NOTE — [place] FL2 (Signed)
[place] MEDICAID FL2 LEVEL OF CARE SCREENING TOOL     IDENTIFICATION  Patient Name: Jordan Santiago Birthdate: 05-29-1931 Sex: male Admission Date (Current Location): 08/12/2015  [facility] [place] [place]   [facility] and Address[facility]  [facility],  [address], [place] [id number]      Provider Number: [id number]  Attending Physician Name and Address:  Clydia LlanoMutaz Elmahi, MD  Relative Name and Phone Number:       Current Level of Care: Hospital Recommended Level of Care: Skilled Nursing Facility Prior Approval Number:    Date Approved/Denied:   PASRR Number: [id number]  Discharge Plan:      Current Diagnoses: Patient Active Problem List   Diagnosis Date Noted  . Pressure ulcer 08/13/2015  . Fall 08/12/2015  . Rhabdomyolysis 08/12/2015  . Renal insufficiency 08/12/2015  . Bilateral lower extremity edema 08/12/2015  . Leukocytosis 08/12/2015  . Elevated lactic acid level 08/12/2015  . Thrombocytopenia (HCC) 08/12/2015  . Generalized weakness 08/12/2015    Orientation RESPIRATION BLADDER Height & Weight     Self, Time, Situation, Place  Normal Continent Weight: 130 lb (58.968 kg) Height:  5\' 7"  (170.2 cm)  BEHAVIORAL SYMPTOMS/MOOD NEUROLOGICAL BOWEL NUTRITION STATUS      Continent Diet (Regular)  AMBULATORY STATUS COMMUNICATION OF NEEDS Skin   Extensive Assist Verbally Normal                       Personal Care Assistance Level of Assistance  Bathing, Dressing Bathing Assistance: Limited assistance   Dressing Assistance: Limited assistance     Functional Limitations Info             SPECIAL CARE FACTORS FREQUENCY  PT (By licensed PT), OT (By licensed OT)     PT Frequency: 5 OT Frequency: 5            Contractures      Additional Factors Info  Code Status, Allergies Code Status Info: Fullcode Allergies Info: NKDA           Current Medications (08/13/2015):  This is the current hospital active medication  list Current Facility-Administered Medications  Medication Dose Route Frequency Provider Last Rate Last Dose  . 0.9 %  sodium chloride infusion   Intravenous Continuous Clydia LlanoMutaz Elmahi, MD 75 mL/hr at 08/13/15 1043    . acetaminophen (TYLENOL) tablet 650 mg  650 mg Oral Q6H PRN Clydia LlanoMutaz Elmahi, MD       Or  . acetaminophen (TYLENOL) suppository 650 mg  650 mg Rectal Q6H PRN Clydia LlanoMutaz Elmahi, MD      . cefTRIAXone (ROCEPHIN) 1 g in dextrose 5 % 50 mL IVPB  1 g Intravenous Q24H Clydia LlanoMutaz Elmahi, MD   1 g at 08/13/15 0822  . heparin injection 5,000 Units  5,000 Units Subcutaneous 3 times per day Clydia LlanoMutaz Elmahi, MD   5,000 Units at 08/13/15 0609  . HYDROcodone-acetaminophen (NORCO/VICODIN) 5-325 MG per tablet 1-2 tablet  1-2 tablet Oral Q4H PRN Clydia LlanoMutaz Elmahi, MD      . ipratropium (ATROVENT) nebulizer solution 0.5 mg  0.5 mg Nebulization QID Clydia LlanoMutaz Elmahi, MD      . morphine 2 MG/ML injection 1 mg  1 mg Intravenous Q4H PRN Clydia LlanoMutaz Elmahi, MD      . ondansetron (ZOFRAN) tablet 4 mg  4 mg Oral Q6H PRN Clydia LlanoMutaz Elmahi, MD       Or  . ondansetron (ZOFRAN) injection 4 mg  4 mg Intravenous Q6H PRN Mutaz Elmahi,  MD      . Melene Muller ON 08/14/2015] pneumococcal 23 valent vaccine (PNU-IMMUNE) injection 0.5 mL  0.5 mL Intramuscular Tomorrow-1000 Mutaz Elmahi, MD      . sodium chloride flush (NS) 0.9 % injection 3 mL  3 mL Intravenous Q12H Clydia Llano, MD   3 mL at 08/13/15 9604     Discharge Medications: Please see discharge summary for a list of discharge medications.  Relevant Imaging Results:  Relevant Lab Results:   Additional Information SSN: [id number]  Arlyss Repress, LCSW

## 2015-08-13 NOTE — Progress Notes (Signed)
[facility] PROGRESS NOTE   Jordan BinetRalf E Santiago NWG:[id number] DOB: 01-10-1932 DOA: 08/12/2015 PCP: No primary care provider on file.  HPI/Subjective: Seen with daughter at bedside, sitting, appears to have more labored breathing than yesterday. Wean oxygen to avoid hypercapnia.  Assessment/Plan: Principal Problem:   Fall Active Problems:   Rhabdomyolysis   Renal insufficiency   Bilateral lower extremity edema   Leukocytosis   Elevated lactic acid level   Thrombocytopenia (HCC)   Generalized weakness   Pressure ulcer   Protein-calorie malnutrition, severe   Sepsis syndrome Patient presented with leukocytosis of 23k, tachycardia and presence of UTI. There is mild end organ damage with lactic acid of 3.2 on admission. Mild fever of 99.3, given multiple boluses of IV fluids, continue IV fluid hydration. Started on Rocephin for UTI.  UTI Urinalysis consistent with UTI, on Rocephin we'll adjust antibiotics according to culture results.  Fall Patient fell a couple of days ago and was not able to stand up and been on the floor since then. Generalized weakness, no evidence of trauma or fractures. Evidence of rhabdomyolysis likely from being on the floor. This is likely secondary to the UTI.  Rhabdomyolysis Mild rhabdomyolysis with CK of 3800. Patient has acute kidney injury not sure if it's acute or secondary to the rhabdomyolysis. Hydrate with IV fluids, check CK down to 919.  Renal sufficiency Creatinine is 1.82, unknown baseline. As mentioned above unclear to me of this is acute or chronic renal insufficiency, hydrate with IV fluids and check BMP in a.m.  Elevated lactic acid Did be secondary to dehydration or sepsis, repeat in a.m.  Thrombocytopenia Asymptomatic, follow  Bilateral lower extremity edema With chronic skin changes, mentioned he has edema for the past year. No history of CKD or CHF, we'll hydrate carefully. Check 2-D echo   Code Status: Full  Code Family Communication: Plan discussed with the patient. Disposition Plan: Remains inpatient Diet: DIET DYS 3 Room service appropriate?: Yes with Assist; Fluid consistency:: Thin  Consultants:  None  Procedures:  None  Antibiotics:  Rocephin   Objective: Filed Vitals:   08/13/15 0911 08/13/15 1335  BP:  90/53  Pulse: 112 111  Temp:  98 F (36.7 C)  Resp:  22    Intake/Output Summary (Last 24 hours) at 08/13/15 1603 Last data filed at 08/13/15 1500  Gross per 24 hour  Intake   2900 ml  Output     30 ml  Net   2870 ml   Filed Weights   08/12/15 1257  Weight: 58.968 kg (130 lb)    Exam: General: Alert and awake, oriented x3, not in any acute distress. HEENT: anicteric sclera, pupils reactive to light and accommodation, EOMI CVS: S1-S2 clear, no murmur rubs or gallops Chest: clear to auscultation bilaterally, no wheezing, rales or rhonchi Abdomen: soft nontender, nondistended, normal bowel sounds, no organomegaly Extremities: no cyanosis, clubbing or edema noted bilaterally Neuro: Cranial nerves II-XII intact, no focal neurological deficits  Data Reviewed: Basic Metabolic Panel:  Recent Labs Lab 08/12/15 1322 08/12/15 1325 08/13/15 0507  NA 142  --  146*  K 4.4  --  3.9  CL 104  --  111  CO2 26  --  26  GLUCOSE 84  --  88  BUN 29*  --  34*  CREATININE 1.82*  --  1.80*  CALCIUM 8.3*  --  7.5*  MG  --  2.2  --    Liver Function Tests:  Recent Labs Lab 08/12/15 1322  AST 232*  ALT 59  ALKPHOS 76  BILITOT 1.0  PROT 6.6  ALBUMIN 2.3*   No results for input(s): LIPASE, AMYLASE in the last 168 hours. No results for input(s): AMMONIA in the last 168 hours. CBC:  Recent Labs Lab 08/12/15 1322 08/13/15 0507  WBC 21.7* 23.5*  NEUTROABS 19.4*  --   HGB 12.8* 11.0*  HCT 37.5* 33.1*  MCV 103.0* 106.4*  PLT 81* 110*   Cardiac Enzymes:  Recent Labs Lab 08/12/15 1322 08/13/15 0507  CKTOTAL 3801* 919*   BNP (last 3 results) No  results for input(s): BNP in the last 8760 hours.  ProBNP (last 3 results) No results for input(s): PROBNP in the last 8760 hours.  CBG: No results for input(s): GLUCAP in the last 168 hours.  Micro Recent Results (from the past 240 hour(s))  Blood culture (routine x 2)     Status: None (Preliminary result)   Collection Time: 08/12/15  3:20 PM  Result Value Ref Range Status   Specimen Description BLOOD LEFT ANTECUBITAL  Final   Special Requests BOTTLES DRAWN AEROBIC AND ANAEROBIC 5 CC EA  Final   Culture   Final    NO GROWTH < 24 HOURS Performed at [facility]    Report Status PENDING  Incomplete  Blood culture (routine x 2)     Status: None (Preliminary result)   Collection Time: 08/12/15  3:20 PM  Result Value Ref Range Status   Specimen Description BLOOD LEFT WRIST  Final   Special Requests IN PEDIATRIC BOTTLE 5 CC  Final   Culture   Final    NO GROWTH < 24 HOURS Performed at [facility]    Report Status PENDING  Incomplete  Urine culture     Status: None (Preliminary result)   Collection Time: 08/12/15  4:55 PM  Result Value Ref Range Status   Specimen Description URINE, CLEAN CATCH  Final   Special Requests NONE  Final   Culture   Final    NO GROWTH < 24 HOURS Performed at [facility]    Report Status PENDING  Incomplete     Studies: Dg Chest 2 View  08/12/2015  CLINICAL DATA:  Weakness and fall. EXAM: CHEST  2 VIEW COMPARISON:  None. FINDINGS: COPD with emphysematous changes and hyperinflation. There is trace pleural fluid bilaterally. Nodular opacity of the right mid lung measuring ~20 mm. Nipple shadow over the left base. There is no edema, consolidation, or pneumothorax. Normal heart size and mediastinal contours. IMPRESSION: 1. Emphysema with right-sided nodule. Recommend chest CT, contrast not essential, when clinically appropriate. 2. Trace pleural effusions. Electronically Signed   By: Marnee Spring M.D.   On: 08/12/2015 13:52   Ct  Head Wo Contrast  08/12/2015  CLINICAL DATA:  82 year old post fall three days ago while getting out of chair. EXAM: CT HEAD WITHOUT CONTRAST CT CERVICAL SPINE WITHOUT CONTRAST TECHNIQUE: Multidetector CT imaging of the head and cervical spine was performed following the standard protocol without intravenous contrast. Multiplanar CT image reconstructions of the cervical spine were also generated. COMPARISON:  None. FINDINGS: CT HEAD FINDINGS Ventricles and cisterns are within normal. There is minimal prominence of the CSF spaces compatible with age related atrophy. There is chronic ischemic microvascular disease. There is no mass, mass effect, shift of midline structures or acute hemorrhage. There is no evidence to suggest acute infarction. Remaining bones and soft tissues are within normal. CT CERVICAL SPINE FINDINGS There is straightening of the  normal cervical lordosis with subtle 1-2 mm anterior subluxation of C3 on C4 likely degenerative. Mild to moderate spondylosis of the cervical spine. There is moderate disc space narrowing at the C5-6 and C6-7 levels and to lesser extent at the C4-5 level. Prevertebral soft tissues are within normal. There is moderate uncovertebral joint spurring and mild facet arthropathy. The atlantoaxial articulation is within normal mild neural foraminal narrowing at several levels of the mid to lower cervical spine. No acute fracture or traumatic subluxation. Emphysematous disease over the lung apices. Remainder of the exam is within normal. IMPRESSION: No acute intracranial findings per Mild atrophy and chronic ischemic microvascular disease. No acute cervical spine injury. Mild to moderate spondylosis of the cervical spinal multilevel disc disease from the C4-5 level to the C6-7 level. Mild multilevel neural foraminal narrowing. Emphysematous disease. Electronically Signed   By: Elberta Fortis M.D.   On: 08/12/2015 14:16   Ct Cervical Spine Wo Contrast  08/12/2015  CLINICAL DATA:   82 year old post fall three days ago while getting out of chair. EXAM: CT HEAD WITHOUT CONTRAST CT CERVICAL SPINE WITHOUT CONTRAST TECHNIQUE: Multidetector CT imaging of the head and cervical spine was performed following the standard protocol without intravenous contrast. Multiplanar CT image reconstructions of the cervical spine were also generated. COMPARISON:  None. FINDINGS: CT HEAD FINDINGS Ventricles and cisterns are within normal. There is minimal prominence of the CSF spaces compatible with age related atrophy. There is chronic ischemic microvascular disease. There is no mass, mass effect, shift of midline structures or acute hemorrhage. There is no evidence to suggest acute infarction. Remaining bones and soft tissues are within normal. CT CERVICAL SPINE FINDINGS There is straightening of the normal cervical lordosis with subtle 1-2 mm anterior subluxation of C3 on C4 likely degenerative. Mild to moderate spondylosis of the cervical spine. There is moderate disc space narrowing at the C5-6 and C6-7 levels and to lesser extent at the C4-5 level. Prevertebral soft tissues are within normal. There is moderate uncovertebral joint spurring and mild facet arthropathy. The atlantoaxial articulation is within normal mild neural foraminal narrowing at several levels of the mid to lower cervical spine. No acute fracture or traumatic subluxation. Emphysematous disease over the lung apices. Remainder of the exam is within normal. IMPRESSION: No acute intracranial findings per Mild atrophy and chronic ischemic microvascular disease. No acute cervical spine injury. Mild to moderate spondylosis of the cervical spinal multilevel disc disease from the C4-5 level to the C6-7 level. Mild multilevel neural foraminal narrowing. Emphysematous disease. Electronically Signed   By: Elberta Fortis M.D.   On: 08/12/2015 14:16    Scheduled Meds: . cefTRIAXone (ROCEPHIN)  IV  1 g Intravenous Q24H  . feeding supplement (ENSURE  ENLIVE)  237 mL Oral TID BM  . heparin  5,000 Units Subcutaneous 3 times per day  . ipratropium  0.5 mg Nebulization QID  . [START ON 08/14/2015] pneumococcal 23 valent vaccine  0.5 mL Intramuscular Tomorrow-1000  . sodium chloride flush  3 mL Intravenous Q12H   Continuous Infusions: . sodium chloride 75 mL/hr at 08/13/15 1043       Time spent: 35 minutes    [facility]  [facility] Pager [phone] If 7PM-7AM, please contact night-coverage at [email or web address], password [facility] 08/13/2015, 4:03 PM  LOS: 1 day

## 2015-08-13 NOTE — Evaluation (Signed)
SLP Cancellation Note  Patient Details Name: Jordan Santiago MRN: [id number] DOB: 1931/08/29   Cancelled treatment:       Reason Eval/Treat Not Completed: Fatigue/lethargy limiting ability to participate  Pt did not awaken adequately for po or evaluation.  Daughter Nedra HaiLee reports pt spends much of his time in bed prior to admission.     Donavan Burnetamara Jahi Roza, MS [facility] SLP [phone]

## 2015-08-13 NOTE — Evaluation (Signed)
Occupational Therapy Evaluation Patient Details Name: Jordan Santiago MRN: [id number] DOB: 03-05-1932 Today's Date: 08/13/2015    History of Present Illness Jordan Santiago is a 82 y.o. male brought to the hospital after a fall. Patient reported for the past year he had lower extremity edema, but recently he developed generalized weakness and his overall health was going downhill. PMHx: AMD , ADD, COPD,tinnitus, macular degeneration,OCD, and peripherial neuropathy   Clinical Impression   This 82 yo male admitted with above presents to acute OT with deficits below (see OT problem list) thus affecting his ability to care for himself at home as he was pta. He will benefit from acute OT with follow up OT at SNF. We will continue to follow acutely.    Follow Up Recommendations  SNF    Equipment Recommendations   (TBD at next venue)       Precautions / Restrictions Precautions Precautions: Fall Restrictions Weight Bearing Restrictions: No      Mobility Bed Mobility Overal bed mobility: Needs Assistance Bed Mobility: Supine to Sit     Supine to sit: Total assist;HOB elevated     General bed mobility comments: and use of bed pad with HOB all the way up  Transfers Overall transfer level: Needs assistance   Transfers: Sit to/from Stand;Stand Pivot Transfers Sit to Stand: Mod assist;From elevated surface Stand pivot transfers: Mod assist;From elevated surface       General transfer comment: Therapist in front of him with use of gait belt    Balance Overall balance assessment: Needs assistance Sitting-balance support: Feet supported;No upper extremity supported Sitting balance-Leahy Scale: Poor Sitting balance - Comments: requires someone close by due to tendency to lean posteriorly intermittently   Standing balance support: Bilateral upper extremity supported Standing balance-Leahy Scale: Poor                              ADL Overall ADL's : Needs  assistance/impaired Eating/Feeding: Total assistance (supported sitting)   Grooming: Total assistance (supported sitting)   Upper Body Bathing: Total assistance (supported sitting)   Lower Body Bathing: Total assistance;+2 for physical assistance (+1 Mod A sit<>stand, with addtional +1 for bathing)   Upper Body Dressing : Total assistance (supported sitting)   Lower Body Dressing: +2 for physical assistance;Total assistance (+1 Mod A sit<>stand, with addtional +1 for dressing)   Toilet Transfer: Moderate assistance;Stand-pivot (bed(raised) to recliner going to pt's left)   Toileting- Clothing Manipulation and Hygiene: Total assistance;+2 for physical assistance (+1 Mod A sit<>stand, with addtional +1 for clothing/hygiene)                         Pertinent Vitals/Pain Pain Assessment: Faces Faces Pain Scale: Hurts even more Pain Location: right upper thigh and hip area Pain Descriptors / Indicators: Guarding;Grimacing Pain Intervention(s): Monitored during session;Repositioned     Hand Dominance Right   Extremity/Trunk Assessment Upper Extremity Assessment Upper Extremity Assessment: Generalized weakness   Lower Extremity Assessment Lower Extremity Assessment: Defer to PT evaluation       Communication Communication Communication:  (dysarthric at times)   Cognition Arousal/Alertness: Awake/alert Behavior During Therapy: Flat affect Overall Cognitive Status: Impaired/Different from baseline Area of Impairment: Following commands;Problem solving       Following Commands: Follows one step commands inconsistently (slow to process and sometimes did not even attempt to do what I asked of him (ie: raise your left arm, why?,  I need to see how your arms are moving, OK--but then did not move arm(s)))     Problem Solving: Slow processing;Decreased initiation;Requires verbal cues;Requires tactile cues;Difficulty sequencing (for getting OOB)                Home  Living Family/patient expects to be discharged to:: Skilled nursing facility Living Arrangements: Alone                                      Prior Functioning/Environment Level of Independence: Independent with assistive device(s)        Comments: Used A SPC at home per his daughter in room. Did not cook, just ate out of the refrigerator.    OT Diagnosis: Generalized weakness;Cognitive deficits;Disturbance of vision;Acute pain   OT Problem List: Decreased strength;Decreased range of motion;Impaired balance (sitting and/or standing);Decreased activity tolerance;Pain;Impaired vision/perception;Decreased cognition;Decreased safety awareness;Decreased knowledge of use of DME or AE   OT Treatment/Interventions: Balance training;Therapeutic activities;DME and/or AE instruction;Patient/family education;Self-care/ADL training;Therapeutic exercise    OT Goals(Current goals can be found in the care plan section) Acute Rehab OT Goals Patient Stated Goal: daughter (to SNF v. ALF) I explained the differences to her and why I felt like he needed SNF at this time OT Goal Formulation: With patient/family Time For Goal Achievement: 08/27/15 Potential to Achieve Goals: Fair  OT Frequency: Min 2X/week   Barriers to D/C: Decreased caregiver support             End of Session Equipment Utilized During Treatment: Gait belt Nurse Communication: Mobility status (NT as well; +2 mod A stand pivot)  Activity Tolerance: Patient limited by fatigue Patient left: in chair;with chair alarm set   Time: [phone] OT Time Calculation (min): 33 min Charges:  OT General Charges $OT Visit: 1 Procedure OT Evaluation $OT Eval Moderate Complexity: 1 Procedure OT Treatments $Self Care/Home Management : 8-22 mins  Evette Georges [phone] 08/13/2015, 9:23 AM

## 2015-08-13 NOTE — Progress Notes (Signed)
Initial Nutrition Assessment  DOCUMENTATION CODES:   Severe malnutrition in context of acute illness/injury, Severe malnutrition in context of social or environmental circumstances  INTERVENTION:  - Will order Ensure Enlive TID, each supplement provides 350 kcal and 20 grams of protein - Will monitor for SLP recommendations - RD will continue to monitor for additional nutrition-related needs  NUTRITION DIAGNOSIS:   Inadequate oral intake related to lethargy/confusion as evidenced by per patient/family report.  GOAL:   Patient will meet greater than or equal to 90% of their needs  MONITOR:   PO intake, Weight trends, Labs, Skin, I & O's  REASON FOR ASSESSMENT:   Consult Assessment of nutrition requirement/status  ASSESSMENT:   82 y.o. male with past medical history of COPD and ADD, brought to the hospital after a fall. Patient reported for the past year he had lower extremity edema, but recently he developed generalized weakness and his overall health was going downhill. He reported on Saturday (his daughter mentioned that was just 2 days ago) he slid out of his chair and he remained on the floor until she tried to call him earlier today, he did not call back so she stopped by and he was found to be on the floor, covered by his waste. He mentioned to his daughter that he couldn't get up after he fell.  Pt seen for consult. BMI indicates normal weight. No intakes documented since admission. Pt very lethargic at time of RD visit and unable to maintain full discussion but does indicate abdominal pain. Spoke with daughter, who is at bedside, and provides all information during visit. Daughter states that prior to this week, she [place] her [place]; she reports that this was pt's choice. Daughter states that pt drinks alcohol and smokes and that he lives alone. She states that she is unsure of what or how much he eats on a typical day but that pt has recently expressed  concern over not eating well/difficulty with eating. She states that managers at apartment complex keep an eye on pt and that they have mentioned noting a functional decline over the past few weeks. Pt had expressed to both daughter and a son that he was planning to request Meals on Wheels and other senior services; daughter states that this is out of character for pt as she had encouraged him to go to a facility a year ago and he adamantly declined. Daughter states that when she was in pt's residence earlier in the week she did notice that he had a large supply of Ensure.  Noted pt is missing several teeth and daughter confirms that pt has very poor dentition and overall does not take good care of himself. SLP to work with pt today and was waiting for RD to finish assessment before assessing pt. Daughter is very concerned about nutrition status and eating abilities and is very interested in working closely with RD concerning the same. Explained findings of physical exam to daughter (severe muscle and fat wasting to upper body, moderate to severe edema to BLE). Daughter states she is unsure of UBW and that pt had always worn baggy clothes so she was shocked to see how emaciated his upper body appeared when she came to the hospital.   Pt not meeting needs. Will monitor for SLP recommendations and overall medical course and assist as able. Medications reviewed. Labs reviewed; Na: 146 mmol/L, BUN/creatinine elevated, Ca: 7.5 mg/dL, GFR: 33 mg/dL. IVF: NS @ 75 mL/hr.  Diet Order:  Diet regular Room service appropriate?: Yes; Fluid consistency:: Thin  Skin:  Wound (see comment) (Stage 1-2 L hip pressure injury per notes)  Last BM:  4/13  Height:   Ht Readings from Last 1 Encounters:  08/12/15  (1.702 m)    Weight:   Wt Readings from Last 1 Encounters:  08/12/15 130 lb (58.968 kg)    Ideal Body Weight:  67.27 kg (kg)  BMI:  Body mass index is 20.36 kg/(m^2).  Estimated Nutritional  Needs:   Kcal:  1500-1700  Protein:  70-80 grams  Fluid:  >/= 1.7 L/day  EDUCATION NEEDS:   No education needs identified at this time     Jordan Santiago, RD, LDN Inpatient Clinical Dietitian Pager # [phone] After hours/weekend pager # [phone]

## 2015-08-13 NOTE — Evaluation (Signed)
Clinical/Bedside Swallow Evaluation Patient Details  Name: Jordan Santiago MRN: [id number] Date of Birth: 30-May-1931  Today's Date: 08/13/2015 Time: SLP Start Time (ACUTE ONLY): 1351 SLP Stop Time (ACUTE ONLY): 1445 SLP Time Calculation (min) (ACUTE ONLY): 54 min  Past Medical History:  Past Medical History  Diagnosis Date  . AMD (age related macular degeneration)   . COPD (chronic obstructive pulmonary disease) (HCC)   . Peripheral neuropathy (HCC)   . Tinnitus   . ADD (attention deficit disorder)   . OCD (obsessive compulsive disorder)    Past Surgical History:  Past Surgical History  Procedure Laterality Date  . Appendectomy     HPI:  82 yo male adm to [facility] after fall several days prior. PMH + for COPD, tinnutus, rhabdoymylosis, pressure ulcer, peripheral neuropathy, ADD, AMD and macular degeneration.  Pt with trace pleural effusions, right sided nodule per CXR.    He is on 2 liters oxygen with nonproductive cough.   CT head negative.     Assessment / Plan / Recommendation Clinical Impression  Pt presents wtih generalized weakness, weak phonation and shallow respirations = which will increase his aspiration/pneumonia risk.  His dentition is in poor condition and masticating appeared laborious for him resulting in delays and oral residuals.  Use of applesauce effective to aid oral clearance.    Delayed oral transiting/oral holding also noted with liquids but no indications of aspiration apparent.  Pt did cough x1 during po intake and expectorated minimal amount of viscous secretions - not mixed with with consumed items.    Recommend pt continue a diet with strict precautions to mitigate aspiration.  Using teach back,educated daugher Nedra Hai and pt to findings/recommendations.  Also demonstrated use of oral suction to pt and family again.    Will follow up next week, if SLP intervention indicated over the weekend, please page [phone].  Thanks.      Aspiration Risk  Moderate  aspiration risk    Diet Recommendation Dysphagia 3 (Mech soft);Thin liquid   Liquid Administration via: Cup;Straw Medication Administration: Whole meds with puree Supervision: Patient able to self feed;Full supervision/cueing for compensatory strategies Compensations: Slow rate;Small sips/bites;Minimize environmental distractions;Other (Comment) (use applesauce to help clear oral residuals) Postural Changes: Seated upright at 90 degrees;Remain upright for at least 30 minutes after po intake    Other  Recommendations Oral Care Recommendations: Oral care BID Other Recommendations: Have oral suction available   Follow up Recommendations       Frequency and Duration min 1 x/week  1 week       Prognosis Prognosis for Safe Diet Advancement: Fair      Swallow Study   General Date of Onset: 08/13/15 HPI: 82 yo male adm to [facility] after fall several days prior. PMH + for COPD, tinnutus, rhabdoymylosis, pressure ulcer, peripheral neuropathy, ADD, AMD and macular degeneration.  Pt with trace pleural effusions, right sided nodule per CXR.    He is on 2 liters oxygen with nonproductive cough.   CT head negative.   Type of Study: Bedside Swallow Evaluation Diet Prior to this Study: Regular;Thin liquids Temperature Spikes Noted: Yes (low grade) Respiratory Status: Nasal cannula History of Recent Intubation: No Behavior/Cognition: Alert;Cooperative;Pleasant mood Oral Cavity Assessment: Dry Oral Care Completed by SLP: No Oral Cavity - Dentition: Missing dentition Vision: Functional for self-feeding Self-Feeding Abilities: Able to feed self Patient Positioning: Upright in bed Baseline Vocal Quality: Breathy;Low vocal intensity Volitional Cough: Strong (intermittently productive) Volitional Swallow: Able to elicit    Oral/Motor/Sensory Function  Overall Oral Motor/Sensory Function: Mild impairment (generalized weakness)   Ice Chips Ice chips: Within functional limits Presentation: Spoon   Thin  Liquid Thin Liquid: Impaired Presentation: Cup;Self Fed;Straw Oral Phase Functional Implications: Oral holding;Prolonged oral transit    Nectar Thick Nectar Thick Liquid: Not tested   Honey Thick Honey Thick Liquid: Not tested   Puree Puree: Impaired Oral Phase Impairments: Reduced lingual movement/coordination Oral Phase Functional Implications: Prolonged oral transit;Oral holding   Solid   GO   Solid: Impaired Presentation: Self Fed;Spoon Oral Phase Impairments: Reduced lingual movement/coordination;Impaired mastication Oral Phase Functional Implications: Prolonged oral transit;Impaired mastication;Oral residue        Mills KollerKimball, Jordan Santiago Ann Gibbs Naugle, MS [facility] SLP [phone]

## 2015-08-13 NOTE — Clinical Social Work Note (Signed)
Clinical Social Work Assessment  Patient Details  Name: Jordan BinetRalf E Mentzer MRN: [id number] Date of Birth: Feb 11, 1932  Date of referral:  08/13/15               Reason for consult:  Facility Placement                Permission sought to share information with:  Oceanographeracility Contact Representative Permission granted to share information::  Yes, Verbal Permission Granted  Name::        Agency::     Relationship::     Contact Information:     Housing/Transportation Living arrangements for the past 2 months:  Apartment Source of Information:  Adult Children, Patient Patient Interpreter Needed:  None Criminal Activity/Legal Involvement Pertinent to Current Situation/Hospitalization:  No - Comment as needed Significant Relationships:  Adult Children Lives with:  Self Do you feel safe going back to the place where you live?  No Need for family participation in patient care:  Yes (Comment)  Care giving concerns:  CSW received consult from Dr. Arthor CaptainElmahi for SNF placement.    Social Worker assessment / plan:  CSW spoke with patient's daughter, Nedra HaiLee at bedside re: discharge planning. Daughter is agreeable with plan for SNF, requesting that he be admitted to a facility close to [place]. CSW looked up SNFs via Medicare.gov - daughter [facility]. [facility] contacted [facility] & [facility] have extended a bed offer.   Employment status:  Retired Health and safety inspectornsurance information:  Medicare PT Recommendations:  Skilled Nursing Facility Information / Referral to community resources:  Skilled Nursing Facility  Patient/Family's Response to care:  Patient's daughter was very pleased to hear that they would be able to offer a bed. Daughter plans to transport patient to SNF when released.   Patient/Family's Understanding of and Emotional Response to Diagnosis, Current Treatment, and Prognosis:  Patient's daughter became tearful thinking about how much patient has declined recently.   Emotional  Assessment Appearance:  Appears stated age Attitude/Demeanor/Rapport:    Affect (typically observed):    Orientation:  Oriented to Self, Oriented to Place, Oriented to  Time, Oriented to Situation Alcohol / Substance use:    Psych involvement (Current and /or in the community):     Discharge Needs  Concerns to be addressed:    Readmission within the last 30 days:    Current discharge risk:    Barriers to Discharge:      Arlyss RepressHarrison, Viridiana Spaid F, LCSW 08/13/2015, 2:30 PM

## 2015-08-13 NOTE — Clinical Social Work Placement (Signed)
Patient has a bed at [facility] (ph#: [phone]) SNF. CSW has completed FL2 & will continue to follow and assist with discharge when ready.   Anticipating discharge Monday, 4/17. Daughter to transport to SNF when discharged.    Lincoln MaxinKelly Melo Stauber, LCSW [facility] Clinical Social Worker cell #: [phone]     CLINICAL SOCIAL WORK PLACEMENT  NOTE  Date:  08/13/2015  Patient Details  Name: Jordan Santiago MRN: [id number] Date of Birth: 12-14-1931  Clinical Social Work is seeking post-discharge placement for this patient at the Skilled  Nursing Facility level of care (*CSW will initial, date and re-position this form in  chart as items are completed):  Yes   Patient/family provided with [facility] Clinical Social Work Department's list of facilities offering this level of care within the geographic area requested by the patient (or if unable, by the patient's family).  Yes   Patient/family informed of their freedom to choose among providers that offer the needed level of care, that participate in Medicare, Medicaid or managed care program needed by the patient, have an available bed and are willing to accept the patient.  Yes   Patient/family informed of [facility] ownership interest in [facility] and [facility], as well as of the fact that they are under no obligation to receive care at these facilities.  PASRR submitted to EDS on 08/13/15     PASRR number received on 08/13/15     Existing PASRR number confirmed on       FL2 transmitted to all facilities in geographic area requested by pt/family on 08/13/15     FL2 transmitted to all facilities within larger geographic area on       Patient informed that his/her managed care company has contracts with or will negotiate with certain facilities, including the following:        Yes   Patient/family informed of bed offers received.  Patient chooses bed at       Physician recommends  and patient chooses bed at      Patient to be transferred to   on  .  Patient to be transferred to facility by Patient's daughter, Nedra HaiLee     Patient family notified on   of transfer.  Name of family member notified:        PHYSICIAN       Additional Comment:    _______________________________________________ Arlyss RepressHarrison, Kerrigan Gombos F, LCSW 08/13/2015, 2:43 PM

## 2015-08-13 NOTE — Progress Notes (Signed)
Echocardiogram 2D Echocardiogram has been performed.  08/13/2015 3:40 PM Gertie FeyMichelle Jami Bogdanski, RVT, RDCS, RDMS

## 2015-08-13 NOTE — Consult Note (Signed)
WOC wound consult note Reason for Consult: Hip and heels. Patient does not have pressure injuries; is s/p fall with abraision and blister to hip from friction and bilateral heels are intact and blanching. Patient is OOB in chair. Wound type: Traumatic Pressure Ulcer POA: No Dressing procedure/placement/frequency: Wound Treatment Associate (WTA) is the bedside RN today and preventive/protective dressing (silicone foam) is placed over hip until blister is reabsorbed and abrasion heals.   WOC nursing team will not follow, but will remain available to this patient, the nursing and medical teams.  Please re-consult if needed. Thanks, Ladona MowLaurie Jajuan Skoog, MSN, RN, GNP, Hans EdenCWOCN, CWON-AP, FAAN  Pager# [phone]

## 2015-08-14 LAB — URINE CULTURE: Culture: NO GROWTH

## 2015-08-14 LAB — BASIC METABOLIC PANEL
ANION GAP: 12 (ref 5–15)
BUN: 39 mg/dL — AB (ref 6–20)
CHLORIDE: 113 mmol/L — AB (ref 101–111)
CO2: 21 mmol/L — ABNORMAL LOW (ref 22–32)
Calcium: 7.8 mg/dL — ABNORMAL LOW (ref 8.9–10.3)
Creatinine, Ser: 1.93 mg/dL — ABNORMAL HIGH (ref 0.61–1.24)
GFR calc Af Amer: 36 mL/min — ABNORMAL LOW (ref >60)
GFR, EST NON AFRICAN AMERICAN: 31 mL/min — AB (ref >60)
Glucose, Bld: 80 mg/dL (ref 65–99)
POTASSIUM: 4.3 mmol/L (ref 3.5–5.1)
SODIUM: 146 mmol/L — AB (ref 135–145)

## 2015-08-14 LAB — CBC
HEMATOCRIT: 34.9 % — AB (ref 39.0–52.0)
HEMOGLOBIN: 11.5 g/dL — AB (ref 13.0–17.0)
MCH: 35.3 pg — ABNORMAL HIGH (ref 26.0–34.0)
MCHC: 33 g/dL (ref 30.0–36.0)
MCV: 107.1 fL — ABNORMAL HIGH (ref 78.0–100.0)
Platelets: 97 10*3/uL — ABNORMAL LOW (ref 150–400)
RBC: 3.26 MIL/uL — AB (ref 4.22–5.81)
RDW: 14.6 % (ref 11.5–15.5)
WBC: 15.4 10*3/uL — AB (ref 4.0–10.5)

## 2015-08-14 LAB — LACTIC ACID, PLASMA: Lactic Acid, Venous: 1.5 mmol/L (ref 0.5–2.0)

## 2015-08-14 LAB — GLUCOSE, CAPILLARY
GLUCOSE-CAPILLARY: 79 mg/dL (ref 65–99)
GLUCOSE-CAPILLARY: 89 mg/dL (ref 65–99)
Glucose-Capillary: 83 mg/dL (ref 65–99)

## 2015-08-14 NOTE — Progress Notes (Signed)
[facility] PROGRESS NOTE   Jordan Santiago WJX:[id number] DOB: 11-Jan-1932 DOA: 08/12/2015 PCP: No primary care provider on file.  HPI/Subjective: Patient is sleepy this morning, easy to arouse. Lactic acid improved to 1.5. WBC is decreased to 15.4  Assessment/Plan: Principal Problem:   Generalized weakness Active Problems:   Fall   Rhabdomyolysis   Renal insufficiency   Bilateral lower extremity edema   UTI (urinary tract infection)   Elevated lactic acid level   Thrombocytopenia (HCC)   Pressure ulcer   Protein-calorie malnutrition, severe   Sepsis syndrome Patient presented with leukocytosis of 23k, tachycardia and presence of UTI. There is mild end organ damage with lactic acid of 3.2 on admission. Mild fever of 99.3, given multiple boluses of IV fluids, continue IV fluid hydration. Started on Rocephin for UTI.  UTI Urinalysis consistent with UTI, on Rocephin. Culture showed no growth, urine culture obtained after given 1 dose of Zosyn in the emergency department  Fall Patient fell a couple of days ago and was not able to stand up and been on the floor since then. Generalized weakness, no evidence of trauma or fractures. Evidence of rhabdomyolysis likely from being on the floor. This is likely secondary to the UTI.  Rhabdomyolysis Mild rhabdomyolysis with CK of 3800. Patient has acute kidney injury not sure if it's acute or secondary to the rhabdomyolysis. Hydrate with IV fluids, check CK down to 919.  Renal sufficiency Creatinine is 1.82, unknown baseline. As mentioned above unclear to me of this is acute or chronic renal insufficiency, hydrate with IV fluids and check BMP in a.m. Continue same management.  Elevated lactic acid Did be secondary to dehydration or sepsis, better, 1.5 this morning.  Thrombocytopenia Asymptomatic, follow  Bilateral lower extremity edema With chronic skin changes, mentioned he has edema for the past year. 2-D echo  showed LVEF of 6065% with grade 1 diastolic dysfunction.  Sinus tachycardia Start metoprolol. EKG did not show any arrhythmias, sinus tach with fusion complexes  Code Status: Full Code Family Communication: Plan discussed with the patient. Disposition Plan: Remains inpatient Diet: DIET DYS 3 Room service appropriate?: Yes with Assist; Fluid consistency:: Thin  Consultants:  None  Procedures:  None  Antibiotics:  Rocephin   Objective: Filed Vitals:   08/14/15 0631 08/14/15 0800  BP: 79/49 101/59  Pulse: 113   Temp: 98.1 F (36.7 C)   Resp: 20     Intake/Output Summary (Last 24 hours) at 08/14/15 1209 Last data filed at 08/14/15 0700  Gross per 24 hour  Intake   2020 ml  Output      0 ml  Net   2020 ml   Filed Weights   08/12/15 1257  Weight: 58.968 kg (130 lb)    Exam: General: Alert and awake, oriented x3, not in any acute distress. HEENT: anicteric sclera, pupils reactive to light and accommodation, EOMI CVS: S1-S2 clear, no murmur rubs or gallops Chest: clear to auscultation bilaterally, no wheezing, rales or rhonchi Abdomen: soft nontender, nondistended, normal bowel sounds, no organomegaly Extremities: no cyanosis, clubbing or edema noted bilaterally Neuro: Cranial nerves II-XII intact, no focal neurological deficits  Data Reviewed: Basic Metabolic Panel:  Recent Labs Lab 08/12/15 1322 08/12/15 1325 08/13/15 0507 08/14/15 0509  NA 142  --  146* 146*  K 4.4  --  3.9 4.3  CL 104  --  111 113*  CO2 26  --  26 21*  GLUCOSE 84  --  88 80  BUN 29*  --  34* 39*  CREATININE 1.82*  --  1.80* 1.93*  CALCIUM 8.3*  --  7.5* 7.8*  MG  --  2.2  --   --    Liver Function Tests:  Recent Labs Lab 08/12/15 1322  AST 232*  ALT 59  ALKPHOS 76  BILITOT 1.0  PROT 6.6  ALBUMIN 2.3*   No results for input(s): LIPASE, AMYLASE in the last 168 hours. No results for input(s): AMMONIA in the last 168 hours. CBC:  Recent Labs Lab 08/12/15 1322  08/13/15 0507 08/14/15 0509  WBC 21.7* 23.5* 15.4*  NEUTROABS 19.4*  --   --   HGB 12.8* 11.0* 11.5*  HCT 37.5* 33.1* 34.9*  MCV 103.0* 106.4* 107.1*  PLT 81* 110* 97*   Cardiac Enzymes:  Recent Labs Lab 08/12/15 1322 08/13/15 0507  CKTOTAL 3801* 919*   BNP (last 3 results) No results for input(s): BNP in the last 8760 hours.  ProBNP (last 3 results) No results for input(s): PROBNP in the last 8760 hours.  CBG:  Recent Labs Lab 08/14/15 0748 08/14/15 1131  GLUCAP 79 89    Micro Recent Results (from the past 240 hour(s))  Blood culture (routine x 2)     Status: None (Preliminary result)   Collection Time: 08/12/15  3:20 PM  Result Value Ref Range Status   Specimen Description BLOOD LEFT ANTECUBITAL  Final   Special Requests BOTTLES DRAWN AEROBIC AND ANAEROBIC 5 CC EA  Final   Culture   Final    NO GROWTH < 24 HOURS Performed at [facility]    Report Status PENDING  Incomplete  Blood culture (routine x 2)     Status: None (Preliminary result)   Collection Time: 08/12/15  3:20 PM  Result Value Ref Range Status   Specimen Description BLOOD LEFT WRIST  Final   Special Requests IN PEDIATRIC BOTTLE 5 CC  Final   Culture   Final    NO GROWTH < 24 HOURS Performed at [facility]    Report Status PENDING  Incomplete  Urine culture     Status: None (Preliminary result)   Collection Time: 08/12/15  4:55 PM  Result Value Ref Range Status   Specimen Description URINE, CLEAN CATCH  Final   Special Requests NONE  Final   Culture   Final    NO GROWTH < 24 HOURS Performed at [facility]    Report Status PENDING  Incomplete     Studies: Dg Chest 2 View  08/12/2015  CLINICAL DATA:  Weakness and fall. EXAM: CHEST  2 VIEW COMPARISON:  None. FINDINGS: COPD with emphysematous changes and hyperinflation. There is trace pleural fluid bilaterally. Nodular opacity of the right mid lung measuring ~20 mm. Nipple shadow over the left base. There is no  edema, consolidation, or pneumothorax. Normal heart size and mediastinal contours. IMPRESSION: 1. Emphysema with right-sided nodule. Recommend chest CT, contrast not essential, when clinically appropriate. 2. Trace pleural effusions. Electronically Signed   By: Marnee Spring M.D.   On: 08/12/2015 13:52   Ct Head Wo Contrast  08/12/2015  CLINICAL DATA:  82 year old post fall three days ago while getting out of chair. EXAM: CT HEAD WITHOUT CONTRAST CT CERVICAL SPINE WITHOUT CONTRAST TECHNIQUE: Multidetector CT imaging of the head and cervical spine was performed following the standard protocol without intravenous contrast. Multiplanar CT image reconstructions of the cervical spine were also generated. COMPARISON:  None. FINDINGS: CT HEAD FINDINGS Ventricles and cisterns are within normal. There  is minimal prominence of the CSF spaces compatible with age related atrophy. There is chronic ischemic microvascular disease. There is no mass, mass effect, shift of midline structures or acute hemorrhage. There is no evidence to suggest acute infarction. Remaining bones and soft tissues are within normal. CT CERVICAL SPINE FINDINGS There is straightening of the normal cervical lordosis with subtle 1-2 mm anterior subluxation of C3 on C4 likely degenerative. Mild to moderate spondylosis of the cervical spine. There is moderate disc space narrowing at the C5-6 and C6-7 levels and to lesser extent at the C4-5 level. Prevertebral soft tissues are within normal. There is moderate uncovertebral joint spurring and mild facet arthropathy. The atlantoaxial articulation is within normal mild neural foraminal narrowing at several levels of the mid to lower cervical spine. No acute fracture or traumatic subluxation. Emphysematous disease over the lung apices. Remainder of the exam is within normal. IMPRESSION: No acute intracranial findings per Mild atrophy and chronic ischemic microvascular disease. No acute cervical spine injury.  Mild to moderate spondylosis of the cervical spinal multilevel disc disease from the C4-5 level to the C6-7 level. Mild multilevel neural foraminal narrowing. Emphysematous disease. Electronically Signed   By: Elberta Fortisaniel  Boyle M.D.   On: 08/12/2015 14:16   Ct Cervical Spine Wo Contrast  08/12/2015  CLINICAL DATA:  82 year old post fall three days ago while getting out of chair. EXAM: CT HEAD WITHOUT CONTRAST CT CERVICAL SPINE WITHOUT CONTRAST TECHNIQUE: Multidetector CT imaging of the head and cervical spine was performed following the standard protocol without intravenous contrast. Multiplanar CT image reconstructions of the cervical spine were also generated. COMPARISON:  None. FINDINGS: CT HEAD FINDINGS Ventricles and cisterns are within normal. There is minimal prominence of the CSF spaces compatible with age related atrophy. There is chronic ischemic microvascular disease. There is no mass, mass effect, shift of midline structures or acute hemorrhage. There is no evidence to suggest acute infarction. Remaining bones and soft tissues are within normal. CT CERVICAL SPINE FINDINGS There is straightening of the normal cervical lordosis with subtle 1-2 mm anterior subluxation of C3 on C4 likely degenerative. Mild to moderate spondylosis of the cervical spine. There is moderate disc space narrowing at the C5-6 and C6-7 levels and to lesser extent at the C4-5 level. Prevertebral soft tissues are within normal. There is moderate uncovertebral joint spurring and mild facet arthropathy. The atlantoaxial articulation is within normal mild neural foraminal narrowing at several levels of the mid to lower cervical spine. No acute fracture or traumatic subluxation. Emphysematous disease over the lung apices. Remainder of the exam is within normal. IMPRESSION: No acute intracranial findings per Mild atrophy and chronic ischemic microvascular disease. No acute cervical spine injury. Mild to moderate spondylosis of the cervical  spinal multilevel disc disease from the C4-5 level to the C6-7 level. Mild multilevel neural foraminal narrowing. Emphysematous disease. Electronically Signed   By: Elberta Fortisaniel  Boyle M.D.   On: 08/12/2015 14:16    Scheduled Meds: . cefTRIAXone (ROCEPHIN)  IV  1 g Intravenous Q24H  . feeding supplement (ENSURE ENLIVE)  237 mL Oral TID BM  . heparin  5,000 Units Subcutaneous 3 times per day  . ipratropium-albuterol  3 mL Nebulization TID  . pneumococcal 23 valent vaccine  0.5 mL Intramuscular Tomorrow-1000  . sodium chloride flush  3 mL Intravenous Q12H   Continuous Infusions: . sodium chloride 75 mL/hr at 08/13/15 2241       Time spent: 35 minutes    Philis Doke A  [facility] Pager [phone] If 7PM-7AM, please contact night-coverage at [email or web address], password [facility] 08/14/2015, 12:09 PM  LOS: 2 days

## 2015-08-14 NOTE — Progress Notes (Signed)
Unable to apply condom catheter for strict I's and o's due to swollen scrotum.  MD aware. Will continue to monitor closely.

## 2015-08-14 NOTE — Consult Note (Signed)
WOC wound consult note Reason for Consult: sacrum but actually it was his left hip Patient down at home for 3 days on his left side  Wound type: Deep tissue pressure injury Pressure Ulcer POA: Yes Measurement: 8cm x 5cm x 0.1cm measurement of entire affected area.  Wound bed: dark purple tissue with bulla unroofed and partial thickness skin loss Drainage (amount, consistency, odor) moderate, serous Periwound:intact  Dressing procedure/placement/frequency: Soft silicone foam to protect and insulate. Monitor for changes.  Discussed POC with patient and bedside nurse.  Re consult if needed, will not follow at this time. Thanks  Kenslee Achorn [employer] RN, CWOCN [phone])

## 2015-08-15 ENCOUNTER — Encounter (HOSPITAL_COMMUNITY): Payer: Self-pay | Admitting: Radiology

## 2015-08-15 ENCOUNTER — Inpatient Hospital Stay (HOSPITAL_COMMUNITY): Payer: Medicare Other

## 2015-08-15 LAB — BLOOD GAS, ARTERIAL
ACID-BASE DEFICIT: 4.6 mmol/L — AB (ref 0.0–2.0)
Bicarbonate: 21.4 mEq/L (ref 20.0–24.0)
O2 Content: 2 L/min
O2 SAT: 96.3 %
PATIENT TEMPERATURE: 98.6
TCO2: 20 mmol/L (ref 0–100)
pCO2 arterial: 46 mmHg — ABNORMAL HIGH (ref 35.0–45.0)
pH, Arterial: 7.29 — ABNORMAL LOW (ref 7.350–7.450)
pO2, Arterial: 88.9 mmHg (ref 80.0–100.0)

## 2015-08-15 LAB — CBC
HEMATOCRIT: 34.3 % — AB (ref 39.0–52.0)
HEMOGLOBIN: 11.2 g/dL — AB (ref 13.0–17.0)
MCH: 34.3 pg — ABNORMAL HIGH (ref 26.0–34.0)
MCHC: 32.7 g/dL (ref 30.0–36.0)
MCV: 104.9 fL — AB (ref 78.0–100.0)
Platelets: 106 10*3/uL — ABNORMAL LOW (ref 150–400)
RBC: 3.27 MIL/uL — AB (ref 4.22–5.81)
RDW: 14.7 % (ref 11.5–15.5)
WBC: 18.5 10*3/uL — AB (ref 4.0–10.5)

## 2015-08-15 LAB — BASIC METABOLIC PANEL
Anion gap: 12 (ref 5–15)
BUN: 49 mg/dL — AB (ref 6–20)
CHLORIDE: 115 mmol/L — AB (ref 101–111)
CO2: 22 mmol/L (ref 22–32)
Calcium: 8.3 mg/dL — ABNORMAL LOW (ref 8.9–10.3)
Creatinine, Ser: 1.88 mg/dL — ABNORMAL HIGH (ref 0.61–1.24)
GFR calc Af Amer: 37 mL/min — ABNORMAL LOW (ref >60)
GFR calc non Af Amer: 32 mL/min — ABNORMAL LOW (ref >60)
Glucose, Bld: 90 mg/dL (ref 65–99)
Potassium: 4.2 mmol/L (ref 3.5–5.1)
SODIUM: 149 mmol/L — AB (ref 135–145)

## 2015-08-15 LAB — AMMONIA: AMMONIA: 12 umol/L (ref 9–35)

## 2015-08-15 MED ORDER — UNJURY CHICKEN SOUP POWDER
8.0000 [oz_av] | ORAL | Status: DC
  Administered 2015-08-15: 8 [oz_av] via ORAL
  Filled 2015-08-15 (×5): qty 27

## 2015-08-15 NOTE — Progress Notes (Signed)
[facility] PROGRESS NOTE   Jordan Santiago:[id number] DOB: 1931-05-04 DOA: 08/12/2015 PCP: No primary care provider on file.  HPI/Subjective: Continues to be sleepy, spoke with daughter at bedside. Reported he did not see a doctor for 18 years. Chest x-ray showed nodule in the right lung will obtain CT without contrast.  Assessment/Plan: Principal Problem:   Generalized weakness Active Problems:   Fall   Rhabdomyolysis   Renal insufficiency   Bilateral lower extremity edema   UTI (urinary tract infection)   Elevated lactic acid level   Thrombocytopenia (HCC)   Pressure ulcer   Protein-calorie malnutrition, severe   Sepsis syndrome Patient presented with leukocytosis of 23k, tachycardia and presence of UTI. There is mild end organ damage with lactic acid of 3.2 on admission. Mild fever of 99.3, given multiple boluses of IV fluids, continue IV fluid hydration. Started on Rocephin for UTI.  UTI Urinalysis consistent with UTI, on Rocephin. Culture showed no growth, urine culture obtained after given 1 dose of Zosyn in the emergency department  Fall Patient fell a couple of days ago and was not able to stand up and been on the floor since then. Generalized weakness, no evidence of trauma or fractures. Evidence of rhabdomyolysis likely from being on the floor. This is likely secondary to the UTI.  Rhabdomyolysis Mild rhabdomyolysis with CK of 3800. Patient has acute kidney injury not sure if it's acute or secondary to the rhabdomyolysis. Hydrate with IV fluids, check CK down to 919.  Renal sufficiency Creatinine is 1.82, unknown baseline. Appears to be chronic, stable after hydration with IV fluids for 48 hours, IV fluids increased to 15 mL/hour  Elevated lactic acid Did be secondary to dehydration or sepsis, better, 1.5 this morning.  Thrombocytopenia Asymptomatic, follow  Bilateral lower extremity edema With chronic skin changes, mentioned he has edema  for the past year. 2-D echo showed LVEF of 6065% with grade 1 diastolic dysfunction.  Sinus tachycardia Start metoprolol. EKG did not show any arrhythmias, sinus tach with fusion complexes.   Code Status: Full Code Family Communication: Plan discussed with the patient. Disposition Plan: Remains inpatient Diet: DIET DYS 3 Room service appropriate?: Yes with Assist; Fluid consistency:: Thin  Consultants:  None  Procedures:  None  Antibiotics:  Rocephin   Objective: Filed Vitals:   08/14/15 2115 08/15/15 0516  BP: 93/50 103/54  Pulse: 113 103  Temp: 97.4 F (36.3 C) 98.7 F (37.1 C)  Resp:      Intake/Output Summary (Last 24 hours) at 08/15/15 1406 Last data filed at 08/15/15 1300  Gross per 24 hour  Intake 1884.75 ml  Output      0 ml  Net 1884.75 ml   Filed Weights   08/12/15 1257  Weight: 58.968 kg (130 lb)    Exam: General: Alert and awake, oriented x3, not in any acute distress. HEENT: anicteric sclera, pupils reactive to light and accommodation, EOMI CVS: S1-S2 clear, no murmur rubs or gallops Chest: clear to auscultation bilaterally, no wheezing, rales or rhonchi Abdomen: soft nontender, nondistended, normal bowel sounds, no organomegaly Extremities: no cyanosis, clubbing or edema noted bilaterally Neuro: Cranial nerves II-XII intact, no focal neurological deficits  Data Reviewed: Basic Metabolic Panel:  Recent Labs Lab 08/12/15 1322 08/12/15 1325 08/13/15 0507 08/14/15 0509 08/15/15 0540  NA 142  --  146* 146* 149*  K 4.4  --  3.9 4.3 4.2  CL 104  --  111 113* 115*  CO2 26  --  26 21*  22  GLUCOSE 84  --  88 80 90  BUN 29*  --  34* 39* 49*  CREATININE 1.82*  --  1.80* 1.93* 1.88*  CALCIUM 8.3*  --  7.5* 7.8* 8.3*  MG  --  2.2  --   --   --    Liver Function Tests:  Recent Labs Lab 08/12/15 1322  AST 232*  ALT 59  ALKPHOS 76  BILITOT 1.0  PROT 6.6  ALBUMIN 2.3*   No results for input(s): LIPASE, AMYLASE in the last 168  hours.  Recent Labs Lab 08/15/15 1024  AMMONIA 12   CBC:  Recent Labs Lab 08/12/15 1322 08/13/15 0507 08/14/15 0509 08/15/15 0540  WBC 21.7* 23.5* 15.4* 18.5*  NEUTROABS 19.4*  --   --   --   HGB 12.8* 11.0* 11.5* 11.2*  HCT 37.5* 33.1* 34.9* 34.3*  MCV 103.0* 106.4* 107.1* 104.9*  PLT 81* 110* 97* 106*   Cardiac Enzymes:  Recent Labs Lab 08/12/15 1322 08/13/15 0507  CKTOTAL 3801* 919*   BNP (last 3 results) No results for input(s): BNP in the last 8760 hours.  ProBNP (last 3 results) No results for input(s): PROBNP in the last 8760 hours.  CBG:  Recent Labs Lab 08/14/15 0748 08/14/15 1131 08/14/15 1701  GLUCAP 79 89 83    Micro Recent Results (from the past 240 hour(s))  Blood culture (routine x 2)     Status: None (Preliminary result)   Collection Time: 08/12/15  3:20 PM  Result Value Ref Range Status   Specimen Description BLOOD LEFT ANTECUBITAL  Final   Special Requests BOTTLES DRAWN AEROBIC AND ANAEROBIC 5 CC EA  Final   Culture   Final    NO GROWTH 2 DAYS [facility]formed at [facility] [place]    Report Status PENDING  Incomplete  Blood culture (routine x 2)     Status: None (Preliminary result)   Collection Time: 08/12/15  3:20 PM  Result Value Ref Range Status   Specimen Description BLOOD LEFT WRIST  Final   Special Requests IN PEDIATRIC BOTTLE 5 CC  Final   Culture   Final    NO GROWTH 2 DAYS Performed at [facility]    Report Status PENDING  Incomplete  Urine culture     Status: None   Collection Time: 08/12/15  4:55 PM  Result Value Ref Range Status   Specimen Description URINE, CLEAN CATCH  Final   Special Requests NONE  Final   Culture   Final    NO GROWTH 2 DAYS Performed at [facility]    Report Status 08/14/2015 FINAL  Final     Studies: No results found.  Scheduled Meds: . cefTRIAXone (ROCEPHIN)  IV  1 g Intravenous Q24H  . feeding supplement (ENSURE ENLIVE)  237 mL Oral TID BM  . heparin  5,000 Units  Subcutaneous 3 times per day  . ipratropium-albuterol  3 mL Nebulization TID  . pneumococcal 23 valent vaccine  0.5 mL Intramuscular Tomorrow-1000  . protein supplement  8 oz Oral Q24H  . sodium chloride flush  3 mL Intravenous Q12H   Continuous Infusions: . sodium chloride 50 mL/hr at 08/15/15 1050       Time spent: 35 minutes    [facility],Anet Logsdon A  [facility] Pager [phone] If 7PM-7AM, please contact night-coverage at [email or web address], password [email or web address] 08/15/2015, 2:06 PM  LOS: 3 days

## 2015-08-15 NOTE — Progress Notes (Signed)
Assumed care of pt at 1500. Agree with previous RN assessment.  Seiya Silsby M. Dayanne Yiu, RN  

## 2015-08-15 NOTE — Progress Notes (Signed)
Nutrition Follow-up  DOCUMENTATION CODES:   Severe malnutrition in context of acute illness/injury, Severe malnutrition in context of social or environmental circumstances  INTERVENTION:   -Continue Ensure Enlive po TID, each supplement provides 350 kcal and 20 grams of protein -Encouraged frequent prompting for PO intake given lethargy -Provide Unjury Chicken Soup daily, each provides 100 kcal and 21g of protein -RD to continue to monitor  NUTRITION DIAGNOSIS:   Inadequate oral intake related to lethargy/confusion as evidenced by per patient/family report.  -Ongoing.  GOAL:   Patient will meet greater than or equal to 90% of their needs  Not meeting.  MONITOR:   PO intake, Supplement acceptance, Labs, Weight trends, Skin, I & O's  ASSESSMENT:   82 y.o. male with past medical history of COPD and ADD, brought to the hospital after a fall. Patient reported for the past year he had lower extremity edema, but recently he developed generalized weakness and his overall health was going downhill. He reported on Saturday (his daughter mentioned that was just 2 days ago) he slid out of his chair and he remained on the floor until she tried to call him earlier today, he did not call back so she stopped by and he was found to be on the floor, covered by his waste. He mentioned to his daughter that he couldn't get up after he fell.  Patient asleep with family at bedside. Per daughter, she has tried to encourage him to eat with pudding and Ensure this morning. Pt closed his mouth and refused to swallow the pudding. Pt seems too lethargic at the moment for PO intake. SLP evaluated 4/14: recommends dysphagia 3 diet with ground meats and thin liquids. Pt has eaten 0-10% of meals. Pt's daughter states he likes soups, RD to order Unjury Chicken Soup to try later. Daughter agreed to try and encourage frequent PO intake, even if it is bites at a time.  Medications reviewed. Labs reviewed: Elevated  Na  Diet Order:  DIET DYS 3 Room service appropriate?: Yes with Assist; Fluid consistency:: Thin  Skin:  Wound (see comment) (Stage 1-2 L hip pressure injury per notes)  Last BM:  4/15  Height:   Ht Readings from Last 1 Encounters:  08/12/15 5\' 7"  (1.702 m)    Weight:   Wt Readings from Last 1 Encounters:  08/12/15 130 lb (58.968 kg)    Ideal Body Weight:  67.27 kg (kg)  BMI:  Body mass index is 20.36 kg/(m^2).  Estimated Nutritional Needs:   Kcal:  1500-1700  Protein:  70-80 grams  Fluid:  >/= 1.7 L/day  EDUCATION NEEDS:   No education needs identified at this time  Tilda FrancoLindsey Jim Lundin, MS, RD, LDN Pager: [phone] After Hours Pager: [phone]

## 2015-08-16 ENCOUNTER — Inpatient Hospital Stay (HOSPITAL_COMMUNITY): Payer: Medicare Other

## 2015-08-16 DIAGNOSIS — R627 Adult failure to thrive: Secondary | ICD-10-CM | POA: Diagnosis present

## 2015-08-16 DIAGNOSIS — E8809 Other disorders of plasma-protein metabolism, not elsewhere classified: Secondary | ICD-10-CM | POA: Diagnosis present

## 2015-08-16 DIAGNOSIS — J9602 Acute respiratory failure with hypercapnia: Secondary | ICD-10-CM | POA: Diagnosis present

## 2015-08-16 LAB — BLOOD GAS, ARTERIAL
ACID-BASE DEFICIT: 4.3 mmol/L — AB (ref 0.0–2.0)
Acid-base deficit: 3.2 mmol/L — ABNORMAL HIGH (ref 0.0–2.0)
BICARBONATE: 24.4 meq/L — AB (ref 20.0–24.0)
BICARBONATE: 23.2 meq/L (ref 20.0–24.0)
DRAWN BY: 295031
Drawn by: 11249
FIO2: 0.6
Mode: POSITIVE
O2 Content: 2 L/min
O2 Saturation: 91.2 %
O2 Saturation: 99.1 %
PATIENT TEMPERATURE: 98.6
PCO2 ART: 66.9 mmHg — AB (ref 35.0–45.0)
PCO2 ART: 50.5 mmHg — AB (ref 35.0–45.0)
PEEP: 5 cmH2O
PO2 ART: 73.6 mmHg — AB (ref 80.0–100.0)
PO2 ART: 160 mmHg — AB (ref 80.0–100.0)
Patient temperature: 99.4
Pressure control: 10 cmH2O
TCO2: 23.3 mmol/L (ref 0–100)
TCO2: 21.8 mmol/L (ref 0–100)
pH, Arterial: 7.191 — CL (ref 7.350–7.450)
pH, Arterial: 7.283 — ABNORMAL LOW (ref 7.350–7.450)

## 2015-08-16 LAB — CBC
HEMATOCRIT: 36.9 % — AB (ref 39.0–52.0)
Hemoglobin: 12 g/dL — ABNORMAL LOW (ref 13.0–17.0)
MCH: 35.1 pg — AB (ref 26.0–34.0)
MCHC: 32.5 g/dL (ref 30.0–36.0)
MCV: 107.9 fL — AB (ref 78.0–100.0)
Platelets: 102 10*3/uL — ABNORMAL LOW (ref 150–400)
RBC: 3.42 MIL/uL — AB (ref 4.22–5.81)
RDW: 15.2 % (ref 11.5–15.5)
WBC: 13.9 10*3/uL — AB (ref 4.0–10.5)

## 2015-08-16 LAB — BASIC METABOLIC PANEL
Anion gap: 12 (ref 5–15)
BUN: 52 mg/dL — AB (ref 6–20)
CHLORIDE: 116 mmol/L — AB (ref 101–111)
CO2: 26 mmol/L (ref 22–32)
CREATININE: 1.76 mg/dL — AB (ref 0.61–1.24)
Calcium: 8.6 mg/dL — ABNORMAL LOW (ref 8.9–10.3)
GFR calc Af Amer: 40 mL/min — ABNORMAL LOW (ref >60)
GFR calc non Af Amer: 34 mL/min — ABNORMAL LOW (ref >60)
Glucose, Bld: 113 mg/dL — ABNORMAL HIGH (ref 65–99)
POTASSIUM: 4.1 mmol/L (ref 3.5–5.1)
SODIUM: 154 mmol/L — AB (ref 135–145)

## 2015-08-16 LAB — MRSA PCR SCREENING: MRSA by PCR: NEGATIVE

## 2015-08-16 MED ORDER — FUROSEMIDE 10 MG/ML IJ SOLN
40.0000 mg | Freq: Two times a day (BID) | INTRAMUSCULAR | Status: DC
  Administered 2015-08-16 – 2015-08-17 (×2): 40 mg via INTRAVENOUS
  Filled 2015-08-16 (×2): qty 4

## 2015-08-16 MED ORDER — FUROSEMIDE 10 MG/ML IJ SOLN
40.0000 mg | Freq: Once | INTRAMUSCULAR | Status: AC
  Administered 2015-08-16: 40 mg via INTRAVENOUS
  Filled 2015-08-16: qty 4

## 2015-08-16 MED ORDER — SODIUM CHLORIDE 0.9 % IV BOLUS (SEPSIS): 250.0000 mL | Freq: Once | INTRAVENOUS | Status: DC

## 2015-08-16 MED ORDER — CHLORHEXIDINE GLUCONATE 0.12 % MT SOLN
15.0000 mL | Freq: Two times a day (BID) | OROMUCOSAL | Status: DC
  Administered 2015-08-16 – 2015-08-17 (×3): 15 mL via OROMUCOSAL

## 2015-08-16 MED ORDER — LORAZEPAM 2 MG/ML IJ SOLN
0.5000 mg | Freq: Once | INTRAMUSCULAR | Status: AC
  Administered 2015-08-16: 0.5 mg via INTRAVENOUS
  Filled 2015-08-16: qty 1

## 2015-08-16 MED ORDER — CETYLPYRIDINIUM CHLORIDE 0.05 % MT LIQD
7.0000 mL | Freq: Two times a day (BID) | OROMUCOSAL | Status: DC
  Administered 2015-08-16 – 2015-08-19 (×5): 7 mL via OROMUCOSAL

## 2015-08-16 NOTE — Progress Notes (Addendum)
[facility] PROGRESS NOTE   Jordan Santiago:[id number] DOB: 1931/06/03 DOA: 08/12/2015 PCP: No primary care provider on file.  HPI/Subjective: Developed SOB and labored breathing early this morning, transferred about 6:30 stem down unit. IV fluids discontinued, started on Lasix. Placed on BiPAP. Have prolonged discussion with the daughter, he will be DNR/DNI, reevaluate later today.  Assessment/Plan: Principal Problem:   Generalized weakness Active Problems:   Fall   Rhabdomyolysis   Renal insufficiency   Bilateral lower extremity edema   UTI (urinary tract infection)   Elevated lactic acid level   Thrombocytopenia (HCC)   Pressure ulcer   Protein-calorie malnutrition, severe    Acute respiratory failure with hypercapnia Patient developed shortness of breath and labored breathing, ABG was obtained. Showed pH of 7.19, bicarbonate of 66 and PO2 of 73. Patient is continuing to be acidotic, now developed hypercapnia, placed on BiPAP. Prolonged discussion with her daughter, he will be DNR/DNI, reevaluate later if not improving, will explore the palliative route.  Sepsis syndrome Patient presented with leukocytosis of 23k, tachycardia and presence of UTI. There is mild end organ damage with lactic acid of 3.2 on admission. Mild fever of 99.3, given multiple boluses of IV fluids, was on IV fluids, discontinued. Started on Rocephin for UTI. The WBC improving.  UTI Urinalysis consistent with UTI, on Rocephin. Culture showed no growth, urine culture obtained after given 1 dose of Zosyn in the emergency department.  Fall Patient fell a couple of days ago and was not able to stand up and been on the floor since then. Generalized weakness, no evidence of trauma or fractures. Evidence of rhabdomyolysis likely from being on the floor. This is likely secondary to the UTI.  Rhabdomyolysis Mild rhabdomyolysis with CK of 3800. Patient has acute kidney injury not sure if it's  acute or secondary to the rhabdomyolysis. Hydrate with IV fluids, check CK down to 919.  Renal sufficiency Creatinine is 1.82, unknown baseline. Appears to be chronic, stable after hydration with IV fluids for 48 hours, IV fluids increased to 15 mL/hour  Elevated lactic acid Did be secondary to dehydration or sepsis, better, 1.5 this morning.  Thrombocytopenia Asymptomatic, follow  Bilateral lower extremity edema With chronic skin changes, mentioned he has edema for the past year. 2-D echo showed LVEF of 60-65% with grade 1 diastolic dysfunction.  Sinus tachycardia Start metoprolol. EKG did not show any arrhythmias, sinus tach with fusion complexes.  Decubitus ulcer Seen by wound team, left hip wound ulcer. Stage I or 2 at most.  COPD Has history of smoking for a long time, CT ordered because of previous nodule, no suspicious masses.  Severe protein energy malnutrition Dietitian evaluated, and context of chronic illness and failure to thrive.  Code Status: Full Code Family Communication: Plan discussed with the patient. Disposition Plan: Remains inpatient Diet: DIET DYS 3 Room service appropriate?: Yes with Assist; Fluid consistency:: Thin  Consultants:  None  Procedures:  None  Antibiotics:  Rocephin   Objective: Filed Vitals:   08/16/15 1100 08/16/15 1120  BP: 98/67 98/67  Pulse:  110  Temp:    Resp: 19 25    Intake/Output Summary (Last 24 hours) at 08/16/15 1218 Last data filed at 08/16/15 1100  Gross per 24 hour  Intake 1105.83 ml  Output    650 ml  Net 455.83 ml   Filed Weights   08/12/15 1257 08/16/15 0800  Weight: 58.968 kg (130 lb) 66.6 kg (146 lb 13.2 oz)    Exam: General:  Alert and awake, oriented x3, not in any acute distress. HEENT: anicteric sclera, pupils reactive to light and accommodation, EOMI CVS: S1-S2 clear, no murmur rubs or gallops Chest: clear to auscultation bilaterally, no wheezing, rales or rhonchi Abdomen: soft  nontender, nondistended, normal bowel sounds, no organomegaly Extremities: no cyanosis, clubbing or edema noted bilaterally Neuro: Cranial nerves II-XII intact, no focal neurological deficits  Data Reviewed: Basic Metabolic Panel:  Recent Labs Lab 08/12/15 1322 08/12/15 1325 08/13/15 0507 08/14/15 0509 08/15/15 0540 08/16/15 0457  NA 142  --  146* 146* 149* 154*  K 4.4  --  3.9 4.3 4.2 4.1  CL 104  --  111 113* 115* 116*  CO2 26  --  26 21* 22 26  GLUCOSE 84  --  88 80 90 113*  BUN 29*  --  34* 39* 49* 52*  CREATININE 1.82*  --  1.80* 1.93* 1.88* 1.76*  CALCIUM 8.3*  --  7.5* 7.8* 8.3* 8.6*  MG  --  2.2  --   --   --   --    Liver Function Tests:  Recent Labs Lab 08/12/15 1322  AST 232*  ALT 59  ALKPHOS 76  BILITOT 1.0  PROT 6.6  ALBUMIN 2.3*   No results for input(s): LIPASE, AMYLASE in the last 168 hours.  Recent Labs Lab 08/15/15 1024  AMMONIA 12   CBC:  Recent Labs Lab 08/12/15 1322 08/13/15 0507 08/14/15 0509 08/15/15 0540 08/16/15 0457  WBC 21.7* 23.5* 15.4* 18.5* 13.9*  NEUTROABS 19.4*  --   --   --   --   HGB 12.8* 11.0* 11.5* 11.2* 12.0*  HCT 37.5* 33.1* 34.9* 34.3* 36.9*  MCV 103.0* 106.4* 107.1* 104.9* 107.9*  PLT 81* 110* 97* 106* 102*   Cardiac Enzymes:  Recent Labs Lab 08/12/15 1322 08/13/15 0507  CKTOTAL 3801* 919*   BNP (last 3 results) No results for input(s): BNP in the last 8760 hours.  ProBNP (last 3 results) No results for input(s): PROBNP in the last 8760 hours.  CBG:  Recent Labs Lab 08/14/15 0748 08/14/15 1131 08/14/15 1701  GLUCAP 79 89 83    Micro Recent Results (from the past 240 hour(s))  Blood culture (routine x 2)     Status: None (Preliminary result)   Collection Time: 08/12/15  3:20 PM  Result Value Ref Range Status   Specimen Description BLOOD LEFT ANTECUBITAL  Final   Special Requests BOTTLES DRAWN AEROBIC AND ANAEROBIC 5 CC EA  Final   Culture   Final    NO GROWTH 3 DAYS Performed at [facility]  [place]    Report Status PENDING  Incomplete  Blood culture (routine x 2)     Status: None (Preliminary result)   Collection Time: 08/12/15  3:20 PM  Result Value Ref Range Status   Specimen Description BLOOD LEFT WRIST  Final   Special Requests IN PEDIATRIC BOTTLE 5 CC  Final   Culture   Final    NO GROWTH 3 DAYS Performed at [facility] [place]    Report Status PENDING  Incomplete  Urine culture     Status: None   Collection Time: 08/12/15  4:55 PM  Result Value Ref Range Status   Specimen Description URINE, CLEAN CATCH  Final   Special Requests NONE  Final   Culture   Final    NO GROWTH 2 DAYS Performed at [facility] [place]    Report Status 08/14/2015 FINAL  Final  MRSA PCR Screening  Status: None   Collection Time: 08/16/15  8:20 AM  Result Value Ref Range Status   MRSA by PCR NEGATIVE NEGATIVE Final    Comment:        The GeneXpert MRSA Assay (FDA approved for NASAL specimens only), is one component of a comprehensive MRSA colonization surveillance program. It is not intended to diagnose MRSA infection nor to guide or monitor treatment for MRSA infections.      Studies: Ct Chest Wo Contrast  08/15/2015  CLINICAL DATA:  Hypoxia EXAM: CT CHEST WITHOUT CONTRAST TECHNIQUE: Multidetector CT imaging of the chest was performed following the standard protocol without IV contrast. COMPARISON:  08/12/2015 FINDINGS: Lungs are well aerated bilaterally but demonstrate evidence of emphysematous change. Bilateral pleural effusions right greater than left are seen. A loculated component extending into the major fissure is noted posteriorly. Some adjacent atelectatic changes or early infiltrate are seen. This is felt to correspond to the previously seen nodular density. A tiny 2-3 mm nodule is noted in the left upper lobe best seen on image number 21 of series 5. The thoracic inlet is within normal limits. The thoracic aorta shows diffuse calcification without aneurysmal  dilatation. No hilar or mediastinal adenopathy is seen. Changes consistent with cirrhosis of the liver seen. Generalized mild to moderate ascites is noted. Multiple gallstones are seen. Multiple left renal calculi without obstructive changes are noted. The osseous structures show degenerative change of the thoracic spine. IMPRESSION: Bilateral pleural effusions right greater than left with some loculation within the major fissure. Adjacent irregular atelectasis is noted in the right lower lobe superiorly related to the effusions. No other focal infiltrate is noted. This atelectatic change corresponds to the nodular density seen on the prior exam. Short-term followup is recommended in 3 months when the patient's condition improves. Tiny nodule in the left upper lobe. This can also be followed with subsequent CT imaging as described COPD. Electronically Signed   By: Alcide Clever M.D.   On: 08/15/2015 17:21   Dg Chest Port 1 View  08/16/2015  CLINICAL DATA:  Short of breath EXAM: PORTABLE CHEST 1 VIEW COMPARISON:  08/12/2015 FINDINGS: Bilateral central and basilar haziness has developed consistent with in down bilateral pleural effusions and bibasilar volume loss. Normal heart size. Hyperaeration. No pneumothorax. Right peripheral and lower lung zone nodular density is stable. IMPRESSION: Bilateral pleural effusions with bibasilar hypoaeration. Electronically Signed   By: Jolaine Click M.D.   On: 08/16/2015 07:00    Scheduled Meds: . antiseptic oral rinse  7 mL Mouth Rinse q12n4p  . cefTRIAXone (ROCEPHIN)  IV  1 g Intravenous Q24H  . chlorhexidine  15 mL Mouth Rinse BID  . feeding supplement (ENSURE ENLIVE)  237 mL Oral TID BM  . furosemide  40 mg Intravenous Once  . heparin  5,000 Units Subcutaneous 3 times per day  . ipratropium-albuterol  3 mL Nebulization TID  . pneumococcal 23 valent vaccine  0.5 mL Intramuscular Tomorrow-1000  . protein supplement  8 oz Oral Q24H  . sodium chloride flush  3 mL  Intravenous Q12H   Continuous Infusions:       Time spent: 35 minutes    [facility]  [facility] Pager [phone] If 7PM-7AM, please contact night-coverage at [email or web address], password [facility] 08/16/2015, 12:18 PM  LOS: 4 days

## 2015-08-16 NOTE — Progress Notes (Addendum)
[facility] progress note. Chief complaint. Dyspnea, altered mental status. History of present illness. This 82 year old male was admitted following a fall with prolonged time down. Diagnosed with sepsis thought secondary to UTI. Was noted to have rhabdomyolysis with acute renal insufficiency and elevated lactic acid. Patient has a history of COPD. Blood gas on nasal cannula oxygen. The blood gas was obtained on 08/15/15 noting pH 7.29, PCO2 46, PO2 88.9, bicarbonate 21.4. Nursing noted the patient was conversational been baseline and more tachypnea With use of accessory respiratory muscles. O2 sats remained stable on nasal cannula oxygen but there is clearly increase work of breathing. A repeat arterial blood gas was obtained showing pH 7.19, PCO2 66.9, PO2 73.6, bicarbonate 24.4. A portable chest x-ray was requested and this result is pending. Patient did have a CT scan of the chest yesterday noting bilateral pleural effusions with some loculation. Atelectasis in the right lower lobe with no focal infiltrates noted. Physical exam. Vital signs. Temperature 98.2, pulse 108, respiration 24, blood pressure 104/60. O2 sats 95% on nasal cannula oxygen. General appearance. Frail elderly male who is alert but minimally verbal. Difficulty talking in full sentences due to dyspnea. Patient is using accessory respiratory muscles. Cardiac. Regular but tachycardic. No jugular venous distention but does have bilateral pedal edema. Lungs. Breath sounds are reduced in all fields. No crackles appreciated. Patient demonstrates dyspnea with use of accessory respiratory muscles. Abdomen. Soft and mildly protuberant with positive bowel sounds. No evidence pain. Impression/plan. Problem #1. Hypercarbic respiratory failure. Despite patient's age, comorbidity, and frailty he remains full code. I've asked staff to move the patient to step down and initiate patient on BiPAP. An ABG should be rechecked in approximately 3-4  hours and I've ordered this for that time frame. Patient's respiratory status does not improve on BiPAP may need to consider intubation. Chest x-ray is pending we'll defer decision on antibiotics until results can be reviewed. My suspicion is possible CHF. We'll administer 40 mg of Lasix IV as a one-time dose. Follow diuresis.

## 2015-08-16 NOTE — Progress Notes (Signed)
SLP Cancellation Note  Patient Details Name: Leodis BinetRalf E Backes MRN: [id number] DOB: 1931/10/31   Cancelled treatment:       Reason Eval/Treat Not Completed: Medical issues which prohibited therapy  Pt on bipap.   Chales AbrahamsKimball, Ashtan Girtman Ann 08/16/2015, 4:35 PM

## 2015-08-16 NOTE — Progress Notes (Signed)
CMT notified writer of patients oxygen saturations dropping into 70s. Writer went to assess patient and noticed the nasal canula was not in the patient's nose. Placed nasal canula back in patient's nose. Upon further assessment shallow rapid respirations noted at RR 30, patient more lethargic with AMS. Lung sounds diminished. RT called to assess breathing. RRT called to bedside and NP on call notified. New orders given and followed. Labs resulted and new orders to transfer to SDU. Patient transferred to SDU and report given.

## 2015-08-16 NOTE — Progress Notes (Signed)
PT Cancellation Note  Patient Details Name: Jordan BinetRalf E Santiago MRN: [id number] DOB: 06-Oct-1931   Cancelled Treatment:    Reason Eval/Treat Not Completed: Medical issues which prohibited therapy (on Bipap; hold per RN)   Drucilla ChaletWILLIAMS,Teffany Blaszczyk 08/16/2015, 12:55 PM

## 2015-08-16 NOTE — Progress Notes (Signed)
Date:  August 16, 2015 Chart reviewed for concurrent status and case management needs. Will continue to follow patient for changes and needs: BiPap Jordan Santiago, BSN, RN, [place]   [phone]

## 2015-08-17 LAB — CULTURE, BLOOD (ROUTINE X 2)
Culture: NO GROWTH
Culture: NO GROWTH

## 2015-08-17 LAB — BASIC METABOLIC PANEL
ANION GAP: 13 (ref 5–15)
BUN: 55 mg/dL — AB (ref 6–20)
CALCIUM: 8.6 mg/dL — AB (ref 8.9–10.3)
CO2: 25 mmol/L (ref 22–32)
Chloride: 118 mmol/L — ABNORMAL HIGH (ref 101–111)
Creatinine, Ser: 1.76 mg/dL — ABNORMAL HIGH (ref 0.61–1.24)
GFR calc Af Amer: 40 mL/min — ABNORMAL LOW (ref >60)
GFR, EST NON AFRICAN AMERICAN: 34 mL/min — AB (ref >60)
Glucose, Bld: 95 mg/dL (ref 65–99)
POTASSIUM: 4.2 mmol/L (ref 3.5–5.1)
SODIUM: 156 mmol/L — AB (ref 135–145)

## 2015-08-17 LAB — CBC
HEMATOCRIT: 32.7 % — AB (ref 39.0–52.0)
Hemoglobin: 10.8 g/dL — ABNORMAL LOW (ref 13.0–17.0)
MCH: 34.7 pg — ABNORMAL HIGH (ref 26.0–34.0)
MCHC: 33 g/dL (ref 30.0–36.0)
MCV: 105.1 fL — ABNORMAL HIGH (ref 78.0–100.0)
Platelets: 77 10*3/uL — ABNORMAL LOW (ref 150–400)
RBC: 3.11 MIL/uL — ABNORMAL LOW (ref 4.22–5.81)
RDW: 15.1 % (ref 11.5–15.5)
WBC: 11.8 10*3/uL — AB (ref 4.0–10.5)

## 2015-08-17 MED ORDER — VITAMINS A & D EX OINT
TOPICAL_OINTMENT | CUTANEOUS | Status: AC
  Administered 2015-08-17: 22:00:00
  Filled 2015-08-17: qty 5

## 2015-08-17 MED ORDER — MORPHINE SULFATE (PF) 2 MG/ML IV SOLN
1.0000 mg | INTRAVENOUS | Status: DC | PRN
  Administered 2015-08-17 – 2015-08-18 (×4): 1 mg via INTRAVENOUS
  Filled 2015-08-17 (×4): qty 1

## 2015-08-17 MED ORDER — VITAMINS A & D EX OINT
TOPICAL_OINTMENT | CUTANEOUS | Status: AC
  Administered 2015-08-17: 19:00:00
  Filled 2015-08-17: qty 5

## 2015-08-17 MED ORDER — IPRATROPIUM-ALBUTEROL 0.5-2.5 (3) MG/3ML IN SOLN: 3.0000 mL | RESPIRATORY_TRACT | Status: DC | PRN

## 2015-08-17 MED ORDER — BIOTENE DRY MOUTH MT LIQD: 15.0000 mL | OROMUCOSAL | Status: DC | PRN

## 2015-08-17 NOTE — Progress Notes (Signed)
[facility] PROGRESS NOTE   Jordan Santiago WJX:[id number] DOB: [birth year] DOA: 08/12/2015 PCP: No primary care provider on file.  HPI: 82 year old male with past medical history of COPD and known severity came in the hospital after he did not see any doctor for about 18 years. Patient brought by EMS and patient was found on the floor for about 2-3 days (unknown period of time). Admitted to the hospital with fall, renal insufficiency and elevated lactic acid, was not improving, had very poor oral intake, increased sleepiness, discussed with the patient's daughter, after she seeing he is not progressing patient is to be for full comfort, was on a step down briefly for hypercapnia so we'll transfer back to the floor for full palliative care. Palliative care NOT consulted. On morphine for shortness of breath.  Subjective: Appears to be comfortable, seen with daughter at bedside. Transfer to regular floor, morphine added for SOB and pain. Discontinued labs and antibiotics, continue oxygen and breathing treatments.  Assessment/Plan: Principal Problem:   Generalized weakness Active Problems:   Fall   Rhabdomyolysis   Renal insufficiency   Bilateral lower extremity edema   UTI (urinary tract infection)   Elevated lactic acid level   Thrombocytopenia (HCC)   Pressure ulcer   Protein-calorie malnutrition, severe   Acute respiratory failure with hypercapnia (HCC)   Hypoalbuminemia   Failure to thrive in adult    Acute respiratory failure with hypercapnia Patient developed shortness of breath and labored breathing, ABG was obtained. Showed pH of 7.19, bicarbonate of 66 and PO2 of 73. Patient is continuing to be acidotic, now developed hypercapnia, placed on BiPAP. Prolonged discussion with her daughter, currently DNR/DNI. Patient will be full comfort, transfer out of the SDU.  Sepsis syndrome Patient presented with leukocytosis of 23k, tachycardia and presence of UTI. There is  mild end organ damage with lactic acid of 3.2 on admission. Mild fever of 99.3, given multiple boluses of IV fluids, was on IV fluids, discontinued. Discontinued antibiotics  UTI Urinalysis consistent with UTI, was on Rocephin. Culture showed no growth, urine culture obtained after given 1 dose of Zosyn in the emergency department.  Fall Patient fell a couple of days ago and was not able to stand up and been on the floor since then. Generalized weakness, no evidence of trauma or fractures. Evidence of rhabdomyolysis likely from being on the floor. This is likely secondary to the UTI.  Rhabdomyolysis Mild rhabdomyolysis with CK of 3800. Patient has acute kidney injury not sure if it's acute or secondary to the rhabdomyolysis. Hydrate with IV fluids, check CK down to 919.  Renal sufficiency Creatinine is 1.82, unknown baseline. Appears to be chronic, stable after hydration with IV fluids for 48 hours, IV fluids increased to 15 mL/hour  Elevated lactic acid Did be secondary to dehydration or sepsis, better, 1.5 this morning.  Thrombocytopenia Asymptomatic, follow  Bilateral lower extremity edema With chronic skin changes, mentioned he has edema for the past year. 2-D echo showed LVEF of 60-65% with grade 1 diastolic dysfunction.  Sinus tachycardia Start metoprolol. EKG did not show any arrhythmias, sinus tach with fusion complexes.  Decubitus ulcer Seen by wound team, left hip wound ulcer. Stage I or 2 at most.  COPD Has history of smoking for a long time, CT ordered because of previous nodule, no suspicious masses.  Severe protein energy malnutrition Dietitian evaluated, and context of chronic illness and failure to thrive.   Code Status: DNR Family Communication: Plan discussed with the patient.  Disposition Plan: Remains inpatient Diet: DIET DYS 3 Room service appropriate?: Yes with Assist; Fluid consistency::  Thin  Consultants:  None  Procedures:  None  Antibiotics:  Rocephin   Objective: Filed Vitals:   08/17/15 1200 08/17/15 1218  BP: 77/38   Pulse:    Temp:  96.8 F (36 C)  Resp: 14     Intake/Output Summary (Last 24 hours) at 08/17/15 1420 Last data filed at 08/17/15 1300  Gross per 24 hour  Intake     50 ml  Output   1185 ml  Net  -1135 ml   Filed Weights   08/12/15 1257 08/16/15 0800  Weight: 58.968 kg (130 lb) 66.6 kg (146 lb 13.2 oz)    Exam: General: Alert and awake, oriented x3, not in any acute distress. HEENT: anicteric sclera, pupils reactive to light and accommodation, EOMI CVS: S1-S2 clear, no murmur rubs or gallops Chest: clear to auscultation bilaterally, no wheezing, rales or rhonchi Abdomen: soft nontender, nondistended, normal bowel sounds, no organomegaly Extremities: no cyanosis, clubbing or edema noted bilaterally Neuro: Cranial nerves II-XII intact, no focal neurological deficits  Data Reviewed: Basic Metabolic Panel:  Recent Labs Lab 08/12/15 1325 08/13/15 0507 08/14/15 0509 08/15/15 0540 08/16/15 0457 08/17/15 0306  NA  --  146* 146* 149* 154* 156*  K  --  3.9 4.3 4.2 4.1 4.2  CL  --  111 113* 115* 116* 118*  CO2  --  26 21* 22 26 25   GLUCOSE  --  88 80 90 113* 95  BUN  --  34* 39* 49* 52* 55*  CREATININE  --  1.80* 1.93* 1.88* 1.76* 1.76*  CALCIUM  --  7.5* 7.8* 8.3* 8.6* 8.6*  MG 2.2  --   --   --   --   --    Liver Function Tests:  Recent Labs Lab 08/12/15 1322  AST 232*  ALT 59  ALKPHOS 76  BILITOT 1.0  PROT 6.6  ALBUMIN 2.3*   No results for input(s): LIPASE, AMYLASE in the last 168 hours.  Recent Labs Lab 08/15/15 1024  AMMONIA 12   CBC:  Recent Labs Lab 08/12/15 1322 08/13/15 0507 08/14/15 0509 08/15/15 0540 08/16/15 0457 08/17/15 0306  WBC 21.7* 23.5* 15.4* 18.5* 13.9* 11.8*  NEUTROABS 19.4*  --   --   --   --   --   HGB 12.8* 11.0* 11.5* 11.2* 12.0* 10.8*  HCT 37.5* 33.1* 34.9* 34.3* 36.9*  32.7*  MCV 103.0* 106.4* 107.1* 104.9* 107.9* 105.1*  PLT 81* 110* 97* 106* 102* 77*   Cardiac Enzymes:  Recent Labs Lab 08/12/15 1322 08/13/15 0507  CKTOTAL 3801* 919*   BNP (last 3 results) No results for input(s): BNP in the last 8760 hours.  ProBNP (last 3 results) No results for input(s): PROBNP in the last 8760 hours.  CBG:  Recent Labs Lab 08/14/15 0748 08/14/15 1131 08/14/15 1701  GLUCAP 79 89 83    Micro Recent Results (from the past 240 hour(s))  Blood culture (routine x 2)     Status: None   Collection Time: 08/12/15  3:20 PM  Result Value Ref Range Status   Specimen Description BLOOD LEFT ANTECUBITAL  Final   Special Requests BOTTLES DRAWN AEROBIC AND ANAEROBIC 5 CC EA  Final   Culture   Final    NO GROWTH 5 DAYS [facility]    Report Status 08/17/2015 FINAL  Final  Blood culture (routine x 2)  Status: None   Collection Time: 08/12/15  3:20 PM  Result Value Ref Range Status   Specimen Description BLOOD LEFT WRIST  Final   Special Requests IN PEDIATRIC BOTTLE 5 CC  Final   Culture   Final    NO GROWTH 5 DAYS Performed at [facility]    Report Status 08/17/2015 FINAL  Final  Urine culture     Status: None   Collection Time: 08/12/15  4:55 PM  Result Value Ref Range Status   Specimen Description URINE, CLEAN CATCH  Final   Special Requests NONE  Final   Culture   Final    NO GROWTH 2 DAYS Performed at [facility]    Report Status 08/14/2015 FINAL  Final  MRSA PCR Screening     Status: None   Collection Time: 08/16/15  8:20 AM  Result Value Ref Range Status   MRSA by PCR NEGATIVE NEGATIVE Final    Comment:        The GeneXpert MRSA Assay (FDA approved for NASAL specimens only), is one component of a comprehensive MRSA colonization surveillance program. It is not intended to diagnose MRSA infection nor to guide or monitor treatment for MRSA infections.      Studies: Ct Chest Wo  Contrast  08/15/2015  CLINICAL DATA:  Hypoxia EXAM: CT CHEST WITHOUT CONTRAST TECHNIQUE: Multidetector CT imaging of the chest was performed following the standard protocol without IV contrast. COMPARISON:  08/12/2015 FINDINGS: Lungs are well aerated bilaterally but demonstrate evidence of emphysematous change. Bilateral pleural effusions right greater than left are seen. A loculated component extending into the major fissure is noted posteriorly. Some adjacent atelectatic changes or early infiltrate are seen. This is felt to correspond to the previously seen nodular density. A tiny 2-3 mm nodule is noted in the left upper lobe best seen on image number 21 of series 5. The thoracic inlet is within normal limits. The thoracic aorta shows diffuse calcification without aneurysmal dilatation. No hilar or mediastinal adenopathy is seen. Changes consistent with cirrhosis of the liver seen. Generalized mild to moderate ascites is noted. Multiple gallstones are seen. Multiple left renal calculi without obstructive changes are noted. The osseous structures show degenerative change of the thoracic spine. IMPRESSION: Bilateral pleural effusions right greater than left with some loculation within the major fissure. Adjacent irregular atelectasis is noted in the right lower lobe superiorly related to the effusions. No other focal infiltrate is noted. This atelectatic change corresponds to the nodular density seen on the prior exam. Short-term followup is recommended in 3 months when the patient's condition improves. Tiny nodule in the left upper lobe. This can also be followed with subsequent CT imaging as described COPD. Electronically Signed   By: Alcide Clever M.D.   On: 08/15/2015 17:21   Dg Chest Port 1 View  08/16/2015  CLINICAL DATA:  Short of breath EXAM: PORTABLE CHEST 1 VIEW COMPARISON:  08/12/2015 FINDINGS: Bilateral central and basilar haziness has developed consistent with in down bilateral pleural effusions and  bibasilar volume loss. Normal heart size. Hyperaeration. No pneumothorax. Right peripheral and lower lung zone nodular density is stable. IMPRESSION: Bilateral pleural effusions with bibasilar hypoaeration. Electronically Signed   By: Jolaine Click M.D.   On: 08/16/2015 07:00    Scheduled Meds: . antiseptic oral rinse  7 mL Mouth Rinse q12n4p  . heparin  5,000 Units Subcutaneous 3 times per day  . ipratropium-albuterol  3 mL Nebulization TID  . pneumococcal 23 valent vaccine  0.5 mL Intramuscular Tomorrow-1000  . protein supplement  8 oz Oral Q24H  . sodium chloride flush  3 mL Intravenous Q12H   Continuous Infusions:       Time spent: 35 minutes    [facility] A  [facility] Pager [phone] If 7PM-7AM, please contact night-coverage at [email or web address], password [facility] 08/17/2015, 2:20 PM  LOS: 5 days

## 2015-08-17 NOTE — Progress Notes (Signed)
Spoke with daughter about patient's remaining days of life; she was concerned about her responsibilities if he needed to be transferred to another facility. Assured daughter that medical staff and social worker staff would work with her to address her concerns as well as to know her how patient is progressing.

## 2015-08-17 NOTE — Progress Notes (Signed)
OT Cancellation Note  Patient Details Name: Jordan Santiago MRN: [id number] DOB: Oct 08, 1931   Cancelled Treatment:    Reason Eval/Treat Not Completed: Medical issues which prohibited therapy. Patient with low BP, awaiting palliative involvement, RN reported not medically stable for OT at this time.   Lagina Reader A 08/17/2015, 1:15 PM

## 2015-08-17 NOTE — Progress Notes (Signed)
Pt's daughter requesting removal of BIPAP mask at this time.  Pt placed on 3 LPM [place], RN aware.  RT to monitor and assess as needed.

## 2015-08-17 NOTE — Progress Notes (Signed)
   08/17/15 1400  Clinical Encounter Type  Visited With Family  Visit Type Follow-up;Psychological support;Spiritual support;Critical Care  Referral From Nurse  Consult/Referral To Chaplain  Spiritual Encounters  Spiritual Needs Emotional;Other (Comment) (Pastoral conversation/Support)  Stress Factors  Patient Stress Factors Not reviewed  Family Stress Factors Loss;Exhausted;Health changes;Major life changes  Advance Directives (For Healthcare)  Does patient have an advance directive? Yes  Type of Advance Directive Healthcare Power of Attorney  Does patient want to make changes to advanced directive? Yes - information given  Copy of advanced directive(s) in chart? Yes   I visited with the patient and his daughter per referral from the nurse.  I had worked with the patient and his daughter previously to complete his ProofreaderAdvance Directive.  As I entered the room, the patient was no awake. The daughter was at the bedside very tearful.  She stated that her father wasn't doing well and that he was dying.  The patient's daughter stated that she is extremely exhausted, but has taken some time to go home and sleep. She states that she has good support from her family and children.    Chaplain Clint BolderBrittany Anntonette Madewell M.Div.

## 2015-08-18 DIAGNOSIS — R531 Weakness: Secondary | ICD-10-CM

## 2015-08-18 DIAGNOSIS — R627 Adult failure to thrive: Secondary | ICD-10-CM

## 2015-08-18 DIAGNOSIS — J9602 Acute respiratory failure with hypercapnia: Secondary | ICD-10-CM

## 2015-08-18 MED ORDER — MORPHINE SULFATE (PF) 2 MG/ML IV SOLN
1.0000 mg | INTRAVENOUS | Status: DC | PRN
  Administered 2015-08-19 (×3): 2 mg via INTRAVENOUS
  Filled 2015-08-18 (×4): qty 1

## 2015-08-18 NOTE — Progress Notes (Signed)
Patient is nonverbal, no moaning, comfortable. Daughter at bedside.

## 2015-08-18 NOTE — Progress Notes (Signed)
Nutrition Brief Note  Chart reviewed. Pt now transitioning to comfort care.  No further nutrition interventions warranted at this time.  Please consult as needed.   Roran Wegner, MS, RD, LDN Pager: [phone] After Hours Pager: [phone]    

## 2015-08-18 NOTE — Progress Notes (Signed)
PT Cancellation Note  Patient Details Name: Jordan Santiago MRN: [id number] DOB: 08-04-1931   Cancelled Treatment:    Reason Eval/Treat Not Completed: PT screened, no needs identified, will sign off--pt full comfort care   [facility],Ennifer Harston 08/18/2015, 8:31 AM

## 2015-08-18 NOTE — Progress Notes (Signed)
[facility] PROGRESS NOTE   Jordan Santiago:[id number] DOB: [birth year] DOA: 08/12/2015 PCP: No primary care provider on file.  HPI: 82 year old male with past medical history of COPD and known severity came in the hospital after he did not see any doctor for about 18 years. Patient brought by EMS and patient was found on the floor for about 2-3 days (unknown period of time). Admitted to the hospital with fall, renal insufficiency and elevated lactic acid, was not improving, had very poor oral intake, increased sleepiness, discussed with the patient's daughter, after she seeing he is not progressing patient is to be for full comfort, was on a step down briefly for hypercapnia so we'll transfer back to the floor for full palliative care. Palliative care NOT consulted. On morphine for shortness of breath.  Subjective: Appears to be comfortable, seen with daughter at bedside. Transfer to regular floor, morphine added for SOB and pain. Discontinued labs and antibiotics, continue oxygen and breathing treatments.  Assessment/Plan:  Acute respiratory failure with hypercapnia -Due to COPD and CHF -Patient developed shortness of breath and labored breathing, ABG w/ pH of 7.19, bicarbonate of 66 and PO2 of 73. -he continued to become acidotic, developed hypercapnia, placed on BiPAP. Dr.Elmahi had an extensive discussion with her daughter,he was transitioned to Full comfort care due to resp failure and ongoing long term failure to thrive, severe malnutrition etc  -continue comfort care, morphine PRN -may need Residential Hospice if he doesn't decline soon, d/w daughter  Sepsis syndrome Patient presented with leukocytosis of 23k, tachycardia and presence of UTI with mild end organ damage with lactic acid of 3.2 -Mild fever of 99.3, given multiple boluses of IV fluids, was on IV fluids, discontinued. - Off antibiotics since comfort care  UTI Urinalysis consistent with UTI, was on  Rocephin. Culture showed no growth, urine culture obtained after given 1 dose of Zosyn in the emergency department.  Fall Patient fell a couple of days ago and was not able to stand up and been on the floor since then. Generalized weakness, no evidence of trauma or fractures. Evidence of rhabdomyolysis likely from being on the floor  Rhabdomyolysis Mild rhabdomyolysis with CK of 3800. Patient has acute kidney injury not sure if it's acute or secondary to the rhabdomyolysis. Hydrated with IV fluids, check CK down to 919.  Renal sufficiency Creatinine is 1.82, unknown baseline.  Elevated lactic acid Did be secondary to dehydration or sepsis  Thrombocytopenia Asymptomatic  Bilateral lower extremity edema With chronic skin changes, mentioned he has edema for the past year. 2-D echo showed LVEF of 60-65% with grade 1 diastolic dysfunction.  Decubitus ulcer Seen by wound team, left hip wound ulcer. Stage I or 2   COPD Has history of smoking for a long time, CT ordered because of previous nodule, no suspicious masses.  Severe protein energy malnutrition Dietician evaluated, and context of chronic illness and failure to thrive.  Code Status: DNR Family Communication: Plan discussed with daughter Disposition Plan: Residential Hospice if doesn't decline sooner  Consultants:  None  Procedures:  None  Antibiotics:  Rocephin   Objective: Filed Vitals:   08/17/15 2100 08/18/15 0600  BP: 94/40   Pulse: 88 102  Temp: 97.7 F (36.5 C) 98.3 F (36.8 C)  Resp:      Intake/Output Summary (Last 24 hours) at 08/18/15 1153 Last data filed at 08/18/15 0938  Gross per 24 hour  Intake      0 ml  Output  875 ml  Net   -875 ml   Filed Weights   08/12/15 1257 08/16/15 0800 08/17/15 1500  Weight: 58.968 kg (130 lb) 66.6 kg (146 lb 13.2 oz) 66.599 kg (146 lb 13.2 oz)    Exam: General: obtunded, cachectic, comfortable appearing. CVS: S1-S2 clear, no murmur rubs or  gallops Chest: crackles and ronchi at bases Abdomen: soft nontender, nondistended, normal bowel sounds, no organomegaly Extremities: no cyanosis, clubbing or edema noted bilaterally Neuro: not assessed  Data Reviewed: Basic Metabolic Panel:  Recent Labs Lab 08/12/15 1325 08/13/15 0507 08/14/15 0509 08/15/15 0540 08/16/15 0457 08/17/15 0306  NA  --  146* 146* 149* 154* 156*  K  --  3.9 4.3 4.2 4.1 4.2  CL  --  111 113* 115* 116* 118*  CO2  --  26 21* GLUCOSE  --  88 80 90 113* 95  BUN  --  34* 39* 49* 52* 55*  CREATININE  --  1.80* 1.93* 1.88* 1.76* 1.76*  CALCIUM  --  7.5* 7.8* 8.3* 8.6* 8.6*  MG 2.2  --   --   --   --   --    Liver Function Tests:  Recent Labs Lab 08/12/15 1322  AST 232*  ALT 59  ALKPHOS 76  BILITOT 1.0  PROT 6.6  ALBUMIN 2.3*   No results for input(s): LIPASE, AMYLASE in the last 168 hours.  Recent Labs Lab 08/15/15 1024  AMMONIA 12   CBC:  Recent Labs Lab 08/12/15 1322 08/13/15 0507 08/14/15 0509 08/15/15 0540 08/16/15 0457 08/17/15 0306  WBC 21.7* 23.5* 15.4* 18.5* 13.9* 11.8*  NEUTROABS 19.4*  --   --   --   --   --   HGB 12.8* 11.0* 11.5* 11.2* 12.0* 10.8*  HCT 37.5* 33.1* 34.9* 34.3* 36.9* 32.7*  MCV 103.0* 106.4* 107.1* 104.9* 107.9* 105.1*  PLT 81* 110* 97* 106* 102* 77*   Cardiac Enzymes:  Recent Labs Lab 08/12/15 1322 08/13/15 0507  CKTOTAL 3801* 919*   BNP (last 3 results) No results for input(s): BNP in the last 8760 hours.  ProBNP (last 3 results) No results for input(s): PROBNP in the last 8760 hours.  CBG:  Recent Labs Lab 08/14/15 0748 08/14/15 1131 08/14/15 1701  GLUCAP 79 89 83    Micro Recent Results (from the past 240 hour(s))  Blood culture (routine x 2)     Status: None   Collection Time: 08/12/15  3:20 PM  Result Value Ref Range Status   Specimen Description BLOOD LEFT ANTECUBITAL  Final   Special Requests BOTTLES DRAWN AEROBIC AND ANAEROBIC 5 CC EA  Final   Culture   Final     NO GROWTH 5 DAYS Performed at [facility]    Report Status 08/17/2015 FINAL  Final  Blood culture (routine x 2)     Status: None   Collection Time: 08/12/15  3:20 PM  Result Value Ref Range Status   Specimen Description BLOOD LEFT WRIST  Final   Special Requests IN PEDIATRIC BOTTLE 5 CC  Final   Culture   Final    NO GROWTH 5 DAYS Performed at [facility]    Report Status 08/17/2015 FINAL  Final  Urine culture     Status: None   Collection Time: 08/12/15  4:55 PM  Result Value Ref Range Status   Specimen Description URINE, CLEAN CATCH  Final   Special Requests NONE  Final   Culture   Final  NO GROWTH 2 DAYS [facility]    Report Status 08/14/2015 FINAL  Final  MRSA PCR Screening     Status: None   Collection Time: 08/16/15  8:20 AM  Result Value Ref Range Status   MRSA by PCR NEGATIVE NEGATIVE Final    Comment:        The GeneXpert MRSA Assay (FDA approved for NASAL specimens only), is one component of a comprehensive MRSA colonization surveillance program. It is not intended to diagnose MRSA infection nor to guide or monitor treatment for MRSA infections.      Studies: No results found.  Scheduled Meds: . antiseptic oral rinse  7 mL Mouth Rinse q12n4p  . pneumococcal 23 valent vaccine  0.5 mL Intramuscular Tomorrow-1000  . protein supplement  8 oz Oral Q24H  . sodium chloride flush  3 mL Intravenous Q12H   Continuous Infusions:       Time spent: 35 minutes    Jordan Santiago  [facility] Pager [phone] 7PM-7AM, please contact night-coverage at [email or web address], password [email or web address] 08/18/2015, 11:53 AM  LOS: 6 days

## 2015-08-18 NOTE — Progress Notes (Signed)
OT Note  Patient Details Name: Jordan BinetRalf E Groft MRN: [id number] DOB: 1932-03-02   Cancelled Treatment:    Reason Eval/Treat Not Completed: Other (comment) -- MD notes indicate patient is full comfort care at this time. OT will sign off.  Damean Poffenberger A 08/18/2015, 8:29 AM

## 2015-08-18 NOTE — Progress Notes (Signed)
   08/18/15 0900  Clinical Encounter Type  Visited With Family;Patient not available  Visit Type Follow-up;Psychological support;Spiritual support  Spiritual Encounters  Spiritual Needs Emotional;Other (Comment) (Pastoral Conversation/Support)  Stress Factors  Patient Stress Factors Not reviewed  Family Stress Factors Loss;Exhausted   I had a brief visit with the patient's daughter who has been staying in the room with her father, the patient. The patient's daughter was visibly exhausted; but stated that she was doing fine today.   Please contact Spiritual Care for further assistance.   Chaplain Clint BolderBrittany Cherity Blickenstaff M.Div.

## 2015-08-18 NOTE — Plan of Care (Signed)
Problem: Safety: Goal: Ability to remain free from injury will improve Outcome: Completed/Met Date Met:  08/18/15 Patient is comfort care. No attempts to get out of bed.

## 2015-08-18 NOTE — Progress Notes (Signed)
SLP Cancellation Note  Patient Details Name: Jordan BinetRalf E Mccook MRN: [id number] DOB: April 16, 1932   Cancelled treatment:       Reason Eval/Treat Not Completed: Other (comment) (pt for palliative care referral per MD notes and is on morphine for dyspnea- will s/o, please reorder if desire)   Mills KollerKimball, Tajah Schreiner Ann Caelan Branden, MS [facility] SLP [phone]

## 2015-08-19 DIAGNOSIS — N179 Acute kidney failure, unspecified: Secondary | ICD-10-CM | POA: Insufficient documentation

## 2015-08-19 DIAGNOSIS — R600 Localized edema: Secondary | ICD-10-CM

## 2015-08-19 DIAGNOSIS — E872 Acidosis: Secondary | ICD-10-CM

## 2015-08-19 MED ORDER — ACETAMINOPHEN 325 MG PO TABS: 650.0000 mg | ORAL_TABLET | Freq: Four times a day (QID) | ORAL | Status: AC | PRN

## 2015-08-19 MED ORDER — ONDANSETRON HCL 4 MG PO TABS: 4.0000 mg | ORAL_TABLET | Freq: Four times a day (QID) | ORAL | Status: AC | PRN

## 2015-08-19 MED ORDER — IPRATROPIUM-ALBUTEROL 0.5-2.5 (3) MG/3ML IN SOLN: 3.0000 mL | RESPIRATORY_TRACT | Status: AC | PRN

## 2015-08-19 MED ORDER — MORPHINE SULFATE (CONCENTRATE) 10 MG /0.5 ML PO SOLN: 5.0000 mg | ORAL | Status: AC | PRN

## 2015-08-19 NOTE — Clinical Documentation Improvement (Signed)
Hospitalist  Can the diagnosis of COPD be further specified?   COPD with acute exacerbation  Other  Clinically Undetermined  Document any associated diagnoses/conditions.   Supporting Information: Stated in progress note of 4/19 that acute resp failure sue to COPD and CHF   Please exercise your independent, professional judgment when responding. A specific answer is not anticipated or expected.   Thank Modesta MessingYou,  Tayen Narang L [facility] [place] [phone]

## 2015-08-19 NOTE — Psychosocial Assessment (Signed)
Pt [facility] for palliative/comfort care.   CSW spoke with pt daughter this morning regarding residential hospice vs. SNF. Pt daughter lives in [place], [place] and wants pt close to her home. CSW explored residential hospice facilities in that area, but there are only home hospice agencies in that area.   CSW contacted [facility] as pt original discharge plan was for SNF for rehab. CSW discussed pt current condition and prognosis and plan for comfort measures with facility. SNF reports that facility can accept pt under Medicare and palliative services to follow. [facility] contacted pt daughter to discuss the details about SNF stay. CSW received return phone call from [facility] stating that they had discussed with pt daughter and pt daughter is agreeable and facility can accept pt today.  CSW discussed with pt daughter at bedside. CSW discussed with pt daughter that ambulance transport would be an upfront cost because the distance of [facility] is over a 50 mile radius. Pt daughter spoke with her brothers about payment for [facility] transport and pt family agreeable to pay cost for transport.   CSW spoke with MD who confirmed pt is stable for transfer to [facility] for comfort measures. CSW spoke with [facility] and confirmed that [facility] would have truck to transport pt today. CSW provided pt daughter contact phone number to [facility] to make payment for transport and confirmed [facility] received payment.   CSW facilitated pt discharge needs including contacting facility, faxing pt discharge information to [facility], confirming facility received discharge information, providing RN phone number to call report, discussing with pt daughter at bedside, and arranging ambulance transport for pt to [facility].   Pt daughter tearful, but thankful for assistance and  relieved that pt will be closer to her home for end of life.   No further social work needs identified at this time.  CSW signing off.   Loletta SpecterSuzanna Ordean Fouts, MSW, LCSW Clinical Social Work [phone]

## 2015-08-19 NOTE — Progress Notes (Signed)
[facility] PROGRESS NOTE   Jordan Santiago ZOX:[id number] DOB: 12/02/1931 DOA: 08/12/2015 PCP: No primary care provider on file.  HPI: 82 year old male with past medical history of COPD and known severity came in the hospital after he did not see any doctor for about 18 years. Patient brought by EMS and patient was found on the floor for about 2-3 days (unknown period of time). Admitted to the hospital with fall, renal insufficiency and elevated lactic acid, was not improving, had very poor oral intake, increased sleepiness, discussed with the patient's daughter, after she seeing he is not progressing patient is to be for full comfort, was on a step down briefly for hypercapnia so we'll transfer back to the floor for full palliative care. Palliative care NOT consulted. On morphine for shortness of breath.  Subjective: Resting comfortably, no complaints, No Po intake, on PRN morphine  Assessment/Plan:  Acute respiratory failure with hypercapnia -Due to COPD and CHF -Patient developed shortness of breath and labored breathing, ABG w/ pH of 7.19, bicarbonate of 66 and PO2 of 73. -he continued to become acidotic, developed hypercapnia, placed on BiPAP. Dr.Elmahi had an extensive discussion with her daughter,he was transitioned to Full comfort care due to resp failure and ongoing long term failure to thrive, severe malnutrition etc  -continue comfort care, morphine PRN -will likely need Residential Hospice, d/w daughter -CSW consulted  Sepsis syndrome Patient presented with leukocytosis of 23k, tachycardia and presence of UTI with mild end organ damage with lactic acid of 3.2 -Mild fever of 99.3, given multiple boluses of IV fluids, was on IV fluids, discontinued. - Off antibiotics since comfort care  UTI Urinalysis consistent with UTI, was on Rocephin. Culture showed no growth, urine culture obtained after given 1 dose of Zosyn in the emergency department.  Fall Patient fell a  couple of days ago and was not able to stand up and been on the floor since then. Generalized weakness, no evidence of trauma or fractures. Evidence of rhabdomyolysis likely from being on the floor  Rhabdomyolysis Mild rhabdomyolysis with CK of 3800. Patient has acute kidney injury not sure if it's acute or secondary to the rhabdomyolysis. Hydrated with IV fluids, check CK down to 919.  Renal sufficiency Creatinine is 1.82, unknown baseline.  Elevated lactic acid Did be secondary to dehydration or sepsis  Thrombocytopenia Asymptomatic  Bilateral lower extremity edema With chronic skin changes, mentioned he has edema for the past year. 2-D echo showed LVEF of 60-65% with grade 1 diastolic dysfunction.  Decubitus ulcer Seen by wound team, left hip wound ulcer. Stage I or 2   COPD Has history of smoking for a long time, CT ordered because of previous nodule, no suspicious masses.  Severe protein energy malnutrition Dietician evaluated, and context of chronic illness and failure to thrive.  Code Status: DNR Family Communication: Plan discussed with daughter Disposition Plan: Residential Hospice when bed available  Consultants:  None  Procedures:  None  Antibiotics:  Rocephin   Objective: Filed Vitals:   08/18/15 2151 08/19/15 0600  BP: 87/48 87/51  Pulse: 95 95  Temp: 98.1 F (36.7 C) 97.7 F (36.5 C)  Resp: 12 16    Intake/Output Summary (Last 24 hours) at 08/19/15 1254 Last data filed at 08/19/15 0602  Gross per 24 hour  Intake      0 ml  Output    400 ml  Net   -400 ml   Filed Weights   08/12/15 1257 08/16/15 0800 08/17/15 1500  Weight: 58.968 kg (130 lb) 66.6 kg (146 lb 13.2 oz) 66.599 kg (146 lb 13.2 oz)    Exam: General: opens eyes, non verbal, cachectic, comfortable appearing. CVS: S1-S2 clear, no murmur rubs or gallops Chest: crackles and ronchi at bases Abdomen: soft nontender, nondistended, normal bowel sounds, no  organomegaly Extremities: no cyanosis, clubbing or edema noted bilaterally Neuro: not assessed  Data Reviewed: Basic Metabolic Panel:  Recent Labs Lab 08/12/15 1325 08/13/15 0507 08/14/15 0509 08/15/15 0540 08/16/15 0457 08/17/15 0306  NA  --  146* 146* 149* 154* 156*  K  --  3.9 4.3 4.2 4.1 4.2  CL  --  111 113* 115* 116* 118*  CO2  --  26 21* 22 26 25   GLUCOSE  --  88 80 90 113* 95  BUN  --  34* 39* 49* 52* 55*  CREATININE  --  1.80* 1.93* 1.88* 1.76* 1.76*  CALCIUM  --  7.5* 7.8* 8.3* 8.6* 8.6*  MG 2.2  --   --   --   --   --    Liver Function Tests:  Recent Labs Lab 08/12/15 1322  AST 232*  ALT 59  ALKPHOS 76  BILITOT 1.0  PROT 6.6  ALBUMIN 2.3*   No results for input(s): LIPASE, AMYLASE in the last 168 hours.  Recent Labs Lab 08/15/15 1024  AMMONIA 12   CBC:  Recent Labs Lab 08/12/15 1322 08/13/15 0507 08/14/15 0509 08/15/15 0540 08/16/15 0457 08/17/15 0306  WBC 21.7* 23.5* 15.4* 18.5* 13.9* 11.8*  NEUTROABS 19.4*  --   --   --   --   --   HGB 12.8* 11.0* 11.5* 11.2* 12.0* 10.8*  HCT 37.5* 33.1* 34.9* 34.3* 36.9* 32.7*  MCV 103.0* 106.4* 107.1* 104.9* 107.9* 105.1*  PLT 81* 110* 97* 106* 102* 77*   Cardiac Enzymes:  Recent Labs Lab 08/12/15 1322 08/13/15 0507  CKTOTAL 3801* 919*   BNP (last 3 results) No results for input(s): BNP in the last 8760 hours.  ProBNP (last 3 results) No results for input(s): PROBNP in the last 8760 hours.  CBG:  Recent Labs Lab 08/14/15 0748 08/14/15 1131 08/14/15 1701  GLUCAP 79 89 83    Micro Recent Results (from the past 240 hour(s))  Blood culture (routine x 2)     Status: None   Collection Time: 08/12/15  3:20 PM  Result Value Ref Range Status   Specimen Description BLOOD LEFT ANTECUBITAL  Final   Special Requests BOTTLES DRAWN AEROBIC AND ANAEROBIC 5 CC EA  Final   Culture   Final    NO GROWTH 5 DAYS Performed at [facility]    Report Status 08/17/2015 FINAL  Final  Blood  culture (routine x 2)     Status: None   Collection Time: 08/12/15  3:20 PM  Result Value Ref Range Status   Specimen Description BLOOD LEFT WRIST  Final   Special Requests IN PEDIATRIC BOTTLE 5 CC  Final   Culture   Final    NO GROWTH 5 DAYS Performed at [facility]    Report Status 08/17/2015 FINAL  Final  Urine culture     Status: None   Collection Time: 08/12/15  4:55 PM  Result Value Ref Range Status   Specimen Description URINE, CLEAN CATCH  Final   Special Requests NONE  Final   Culture   Final    NO GROWTH 2 DAYS Performed at [facility]    Report Status 08/14/2015  FINAL  Final  MRSA PCR Screening     Status: None   Collection Time: 08/16/15  8:20 AM  Result Value Ref Range Status   MRSA by PCR NEGATIVE NEGATIVE Final    Comment:        The GeneXpert MRSA Assay (FDA approved for NASAL specimens only), is one component of a comprehensive MRSA colonization surveillance program. It is not intended to diagnose MRSA infection nor to guide or monitor treatment for MRSA infections.      Studies: No results found.  Scheduled Meds: . antiseptic oral rinse  7 mL Mouth Rinse q12n4p  . pneumococcal 23 valent vaccine  0.5 mL Intramuscular Tomorrow-1000  . protein supplement  8 oz Oral Q24H  . sodium chloride flush  3 mL Intravenous Q12H   Continuous Infusions:       Time spent: 35 minutes    Georgian Mcclory  [facility] Pager [phone] If 7PM-7AM, please contact night-coverage at [email or web address], password [facility] 08/19/2015, 12:54 PM  LOS: 7 days

## 2015-08-19 NOTE — Progress Notes (Signed)
   08/19/15 1100  Clinical Encounter Type  Visited With Family  Visit Type Follow-up;Psychological support;Spiritual support  Referral From Family  Consult/Referral To Chaplain  Spiritual Encounters  Spiritual Needs Emotional;Other (Comment) (Pastoral Support)  Stress Factors  Patient Stress Factors Not reviewed  Family Stress Factors Loss;Exhausted;Health changes   I followed up with the patient's daughter.  She has been at the bedside continually and has not gone home. She is exhausted and tearful. Since the patient's admission, I have not seen any other family members present in the room. It is unclear what the patient's daughter's resources are. When asked, she states that she has good support. She states that she has been comforted by the small spurts of conversation that she has been able to have with her father, the patient.  The patient's daughter stated that they are trying to move her father to a Hospice facility closer to where she lives.   Please contact Spiritual Care for further assistance.   Chaplain Clint BolderBrittany Gualberto Wahlen M.Div.

## 2015-08-19 NOTE — Clinical Social Work Placement (Signed)
   CLINICAL SOCIAL WORK PLACEMENT  NOTE  Date:  08/19/2015  Patient Details  Name: Jordan Santiago MRN: [id number] Date of Birth: [birth year]  Clinical Social Work is seeking post-discharge placement for this patient at the Skilled  Nursing Facility level of care (*CSW will initial, date and re-position this form in  chart as items are completed):  Yes   Patient/family provided with [place] Clinical Social Work Department's list of facilities offering this level of care within the geographic area requested by the patient (or if unable, by the patient's family).  Yes   Patient/family informed of their freedom to choose among providers that offer the needed level of care, that participate in Medicare, Medicaid or managed care program needed by the patient, have an available bed and are willing to accept the patient.  Yes   Patient/family informed of [facility]'s ownership interest in [facility] and [facility], as well as of the fact that they are under no obligation to receive care at these facilities.  PASRR submitted to EDS on 08/13/15     PASRR number received on 08/13/15     Existing PASRR number confirmed on       FL2 transmitted to all facilities in geographic area requested by pt/family on 08/13/15     FL2 transmitted to all facilities within larger geographic area on       Patient informed that his/her managed care company has contracts with or will negotiate with certain facilities, including the following:        Yes   Patient/family informed of bed offers received.  Patient [facility] bed at  [facility])     Physician recommends and patient chooses bed at      Patient to be transferred to Other - please specify in the comment section below: ([facility]) on 08/19/15.  Patient to be transferred to facility by [facility]     Patient family notified on 08/19/15 of transfer.  Name of family member notified:  Loletta SpecterSuzanna Kidd,  LCSW      PHYSICIAN Please sign DNR     Additional Comment:    _______________________________________________ Orson EvaKIDD, SUZANNA A, LCSW 08/19/2015, 3:12 PM

## 2015-08-19 NOTE — Clinical Documentation Improvement (Signed)
Hospitalist  Can the diagnosis of CHF be further specified?    Acuity - Acute, Chronic, Acute on Chronic   Type - Systolic, Diastolic, Systolic and Diastolic  Other  Clinically Undetermined   Document any associated diagnoses/conditions   Supporting Information: In progress note of 4/18 it is stated that acute resp failure due to CHF   Please exercise your independent, professional judgment when responding. A specific answer is not anticipated or expected.   Thank Modesta MessingYou,  Keylen Eckenrode L [facility] Information Management [place] [phone]

## 2015-08-19 NOTE — NC FL2 (Signed)
[place] MEDICAID FL2 LEVEL OF CARE SCREENING TOOL     IDENTIFICATION  Patient Name: Jordan Santiago Birthdate: 1931-06-26 Sex: male Admission Date (Current Location): 08/12/2015  [facility] [place] Number:  Producer, television/film/video and Address:  [facility],  [address] [place] [address]      Provider Number: [id number]  Attending Physician Name and Address:  Zannie Cove, MD  Relative Name and Phone Number:       Current Level of Care: Hospital Recommended Level of Care: Skilled Nursing Facility Prior Approval Number:    Date Approved/Denied:   PASRR Number: [id number]  Discharge Plan: SNF    Current Diagnoses: Patient Active Problem List   Diagnosis Date Noted  . Acute respiratory failure with hypercapnia (HCC) 08/16/2015  . Hypoalbuminemia 08/16/2015  . Failure to thrive in adult 08/16/2015  . Pressure ulcer 08/13/2015  . Protein-calorie malnutrition, severe 08/13/2015  . Fall 08/12/2015  . Rhabdomyolysis 08/12/2015  . Renal insufficiency 08/12/2015  . Bilateral lower extremity edema 08/12/2015  . UTI (urinary tract infection) 08/12/2015  . Elevated lactic acid level 08/12/2015  . Thrombocytopenia (HCC) 08/12/2015  . Generalized weakness 08/12/2015    Orientation RESPIRATION BLADDER Height & Weight     Self (responds to pain)  Normal Incontinent Weight: 146 lb 13.2 oz (66.599 kg) Height:   (170.2 cm)  BEHAVIORAL SYMPTOMS/MOOD NEUROLOGICAL BOWEL NUTRITION STATUS   (no behaviors)  (NONE) Incontinent Diet (Diet Dys 3)  AMBULATORY STATUS COMMUNICATION OF NEEDS Skin   Extensive Assist Verbally Normal                       Personal Care Assistance Level of Assistance  Bathing, Feeding, Dressing Bathing Assistance: Maximum assistance Feeding assistance: Limited assistance Dressing Assistance: Maximum assistance     Functional Limitations Info             SPECIAL CARE FACTORS FREQUENCY                   Contractures Contractures Info: Not present    Additional Factors Info  Code Status, Allergies Code Status Info: DNR code status Allergies Info: NKDA           Current Medications (08/19/2015):  This is the current hospital active medication list Current Facility-Administered Medications  Medication Dose Route Frequency Provider Last Rate Last Dose  . acetaminophen (TYLENOL) tablet 650 mg  650 mg Oral Q6H PRN Clydia Llano, MD   650 mg at 08/14/15 0811   Or  . acetaminophen (TYLENOL) suppository 650 mg  650 mg Rectal Q6H PRN Clydia Llano, MD      . antiseptic oral rinse (BIOTENE) solution 15 mL  15 mL Topical PRN Clydia Llano, MD      . antiseptic oral rinse (CPC / CETYLPYRIDINIUM CHLORIDE 0.05%) solution 7 mL  7 mL Mouth Rinse q12n4p Clydia Llano, MD   7 mL at 08/18/15 1200  . ipratropium-albuterol (DUONEB) 0.5-2.5 (3) MG/3ML nebulizer solution 3 mL  3 mL Nebulization Q4H PRN Clydia Llano, MD      . morphine 2 MG/ML injection 1-2 mg  1-2 mg Intravenous Q2H PRN Zannie Cove, MD   2 mg at 08/19/15 0948  . ondansetron (ZOFRAN) tablet 4 mg  4 mg Oral Q6H PRN Clydia Llano, MD       Or  . ondansetron (ZOFRAN) injection 4 mg  4 mg Intravenous Q6H PRN Clydia Llano, MD      . pneumococcal 23  valent vaccine (PNU-IMMUNE) injection 0.5 mL  0.5 mL Intramuscular Tomorrow-1000 Clydia LlanoMutaz Elmahi, MD   0.5 mL at 08/17/15 1000  . protein supplement (UNJURY CHICKEN SOUP) powder 8 oz  8 oz Oral Q24H Clydia LlanoMutaz Elmahi, MD   8 oz at 08/15/15 1734  . sodium chloride flush (NS) 0.9 % injection 3 mL  3 mL Intravenous Q12H Clydia LlanoMutaz Elmahi, MD   3 mL at 08/18/15 2147     Discharge Medications: Please see discharge summary for a list of discharge medications.  Relevant Imaging Results:  Relevant Lab Results:   Additional Information SSN: [id number] Pt to have Palliative Care at SNF for EOL care.   KIDD, SUZANNA A, LCSW

## 2015-08-19 NOTE — Discharge Summary (Signed)
Physician Discharge Summary  Leodis BinetRalf E Goold ZOX:[id number] DOB: [birth year] DOA: 08/12/2015  PCP: No primary care provider on file.  Admit date: 08/12/2015 Discharge date: 08/19/2015  Time spent: 45 minutes  Recommendations for Outpatient Follow-up:  To SNF for Comfort Care only   Discharge Diagnoses:  Principal Problem:   Generalized weakness   Adult failure to thrive   Severe Malnutrition   COPD   Acute CHF-likely diastolic   Fall   Rhabdomyolysis   Renal insufficiency   Bilateral lower extremity edema   UTI (urinary tract infection)   Elevated lactic acid level   Thrombocytopenia (HCC)   Pressure ulcer   Acute respiratory failure with hypercapnia (HCC)   Hypoalbuminemia   Failure to thrive in adult   AKI (acute kidney injury) [facility](HCC)   Discharge Condition: poor  Diet recommendation: comfort feeds  Filed Weights   08/12/15 1257 08/16/15 0800 08/17/15 1500  Weight: 58.968 kg (130 lb) 66.6 kg (146 lb 13.2 oz) 66.599 kg (146 lb 13.2 oz)    History of present illness:  Chief Complaint: Fall HPI:82 year old male with past medical history of COPD came to the hospital after he did not see any doctor for about 18 years. Patient brought by EMS and patient was found on the floor for about 2-3 days (unknown period of time).   Hospital Course:  Admitted to the hospital with fall, renal insufficiency and elevated lactic acid, was not improving, had very poor oral intake, increased lethargy and then developed respiratory failure, Dr.Elmahi who was caring for this patient before me had multiple discussions with the patient's daughter, after she saw that pt was not progressing, [facility]. He was on a step down briefly for hypercapnia then transfered back to the floor for full palliative care/comfort care and started on PRN Morphine.  Acute respiratory failure with hypercapnia -Due to COPD and CHF -Patient developed shortness of breath and  labored breathing, ABG w/ pH of 7.19, bicarbonate of 66 and PO2 of 73. -he continued to become acidotic, developed hypercapnia, placed on BiPAP. Dr.Elmahi had an extensive discussion with her daughter,he was transitioned to Full comfort care due to resp failure and ongoing long term failure to thrive, severe malnutrition etc on 4/18 -continue comfort care, morphine PRN -CSw consulted and will be discharged to SNF for Comfort/End of life care  Sepsis syndrome Patient presented with leukocytosis of 23k, tachycardia and presence of UTI with mild end organ damage with lactic acid of 3.2 -Mild fever of 99.3, given multiple boluses of IV fluids, was on IV fluids, discontinued. - Off antibiotics since comfort care  UTI -see above  Fall Patient fell a couple of days ago and was not able to stand up and been on the floor since then. Generalized weakness, no evidence of trauma or fractures. -with evidence of rhabdomyolysis, improved with IVF  Renal sufficiency Creatinine was 1.82, unknown baseline.  Elevated lactic acid -secondary to dehydration or sepsis  Bilateral lower extremity edema With chronic skin changes, mentioned he has edema for the past year. 2-D echo showed LVEF of 60-65% with grade 1 diastolic dysfunction.  Decubitus ulcer Seen by wound team, left hip wound ulcer. Stage I or 2   COPD Has history of smoking for a long time, CT ordered because of previous nodule, no suspicious masses.  Severe protein energy malnutrition Dietician evaluated, in the context of chronic illness and failure to thrive.  Code Status: DNR  Discharge Exam: Filed Vitals:  08/18/15 2151 08/19/15 0600  BP: 87/48 87/51  Pulse: 95 95  Temp: 98.1 F (36.7 C) 97.7 F (36.5 C)  Resp: 12 16    General: obtunded, arousable, comfortable Cardiovascular: S1S2/RRR Respiratory: scattered ronchi  Discharge Instructions   Discharge Instructions    Diet general    Complete by:  As directed       Discharge instructions    Complete by:  As directed   Comfort feeds, comfort care     Increase activity slowly    Complete by:  As directed           Current Discharge Medication List    START taking these medications   Details  acetaminophen (TYLENOL) 325 MG tablet Take 2 tablets (650 mg total) by mouth every 6 (six) hours as needed for mild pain (or Fever >/= 101).    ipratropium-albuterol (DUONEB) 0.5-2.5 (3) MG/3ML SOLN Take 3 mLs by nebulization every 4 (four) hours as needed. Qty: 360 mL    Morphine Sulfate (MORPHINE CONCENTRATE) 10 mg / 0.5 ml concentrated solution Take 0.25 mLs (5 mg total) by mouth every 3 (three) hours as needed for severe pain, anxiety or shortness of breath. Qty: 30 mL, Refills: 0    ondansetron (ZOFRAN) 4 MG tablet Take 1 tablet (4 mg total) by mouth every 6 (six) hours as needed for nausea. Refills: 0      STOP taking these medications     Multiple Vitamins-Minerals (B COMPLEX PLUS VITAMIN C PO)      Multiple Vitamins-Minerals (OCUVITE PRESERVISION PO)        No Known Allergies Follow-up Information    Follow up with Hospice/Palliative care at SNF.       The results of significant diagnostics from this hospitalization (including imaging, microbiology, ancillary and laboratory) are listed below for reference.    Significant Diagnostic Studies: Dg Chest 2 View  08/12/2015  CLINICAL DATA:  Weakness and fall. EXAM: CHEST  2 VIEW COMPARISON:  None. FINDINGS: COPD with emphysematous changes and hyperinflation. There is trace pleural fluid bilaterally. Nodular opacity of the right mid lung measuring ~20 mm. Nipple shadow over the left base. There is no edema, consolidation, or pneumothorax. Normal heart size and mediastinal contours. IMPRESSION: 1. Emphysema with right-sided nodule. Recommend chest CT, contrast not essential, when clinically appropriate. 2. Trace pleural effusions. Electronically Signed   By: Marnee Spring M.D.   On: 08/12/2015  13:52   Ct Head Wo Contrast  08/12/2015  CLINICAL DATA:  82 year old post fall three days ago while getting out of chair. EXAM: CT HEAD WITHOUT CONTRAST CT CERVICAL SPINE WITHOUT CONTRAST TECHNIQUE: Multidetector CT imaging of the head and cervical spine was performed following the standard protocol without intravenous contrast. Multiplanar CT image reconstructions of the cervical spine were also generated. COMPARISON:  None. FINDINGS: CT HEAD FINDINGS Ventricles and cisterns are within normal. There is minimal prominence of the CSF spaces compatible with age related atrophy. There is chronic ischemic microvascular disease. There is no mass, mass effect, shift of midline structures or acute hemorrhage. There is no evidence to suggest acute infarction. Remaining bones and soft tissues are within normal. CT CERVICAL SPINE FINDINGS There is straightening of the normal cervical lordosis with subtle 1-2 mm anterior subluxation of C3 on C4 likely degenerative. Mild to moderate spondylosis of the cervical spine. There is moderate disc space narrowing at the C5-6 and C6-7 levels and to lesser extent at the C4-5 level. Prevertebral soft tissues are within normal. There  is moderate uncovertebral joint spurring and mild facet arthropathy. The atlantoaxial articulation is within normal mild neural foraminal narrowing at several levels of the mid to lower cervical spine. No acute fracture or traumatic subluxation. Emphysematous disease over the lung apices. Remainder of the exam is within normal. IMPRESSION: No acute intracranial findings per Mild atrophy and chronic ischemic microvascular disease. No acute cervical spine injury. Mild to moderate spondylosis of the cervical spinal multilevel disc disease from the C4-5 level to the C6-7 level. Mild multilevel neural foraminal narrowing. Emphysematous disease. Electronically Signed   By: Elberta Fortis M.D.   On: 08/12/2015 14:16   Ct Chest Wo Contrast  08/15/2015  CLINICAL  DATA:  Hypoxia EXAM: CT CHEST WITHOUT CONTRAST TECHNIQUE: Multidetector CT imaging of the chest was performed following the standard protocol without IV contrast. COMPARISON:  08/12/2015 FINDINGS: Lungs are well aerated bilaterally but demonstrate evidence of emphysematous change. Bilateral pleural effusions right greater than left are seen. A loculated component extending into the major fissure is noted posteriorly. Some adjacent atelectatic changes or early infiltrate are seen. This is felt to correspond to the previously seen nodular density. A tiny 2-3 mm nodule is noted in the left upper lobe best seen on image number 21 of series 5. The thoracic inlet is within normal limits. The thoracic aorta shows diffuse calcification without aneurysmal dilatation. No hilar or mediastinal adenopathy is seen. Changes consistent with cirrhosis of the liver seen. Generalized mild to moderate ascites is noted. Multiple gallstones are seen. Multiple left renal calculi without obstructive changes are noted. The osseous structures show degenerative change of the thoracic spine. IMPRESSION: Bilateral pleural effusions right greater than left with some loculation within the major fissure. Adjacent irregular atelectasis is noted in the right lower lobe superiorly related to the effusions. No other focal infiltrate is noted. This atelectatic change corresponds to the nodular density seen on the prior exam. Short-term followup is recommended in 3 months when the patient's condition improves. Tiny nodule in the left upper lobe. This can also be followed with subsequent CT imaging as described COPD. Electronically Signed   By: Alcide Clever M.D.   On: 08/15/2015 17:21   Ct Cervical Spine Wo Contrast  08/12/2015  CLINICAL DATA:  82 year old post fall three days ago while getting out of chair. EXAM: CT HEAD WITHOUT CONTRAST CT CERVICAL SPINE WITHOUT CONTRAST TECHNIQUE: Multidetector CT imaging of the head and cervical spine was  performed following the standard protocol without intravenous contrast. Multiplanar CT image reconstructions of the cervical spine were also generated. COMPARISON:  None. FINDINGS: CT HEAD FINDINGS Ventricles and cisterns are within normal. There is minimal prominence of the CSF spaces compatible with age related atrophy. There is chronic ischemic microvascular disease. There is no mass, mass effect, shift of midline structures or acute hemorrhage. There is no evidence to suggest acute infarction. Remaining bones and soft tissues are within normal. CT CERVICAL SPINE FINDINGS There is straightening of the normal cervical lordosis with subtle 1-2 mm anterior subluxation of C3 on C4 likely degenerative. Mild to moderate spondylosis of the cervical spine. There is moderate disc space narrowing at the C5-6 and C6-7 levels and to lesser extent at the C4-5 level. Prevertebral soft tissues are within normal. There is moderate uncovertebral joint spurring and mild facet arthropathy. The atlantoaxial articulation is within normal mild neural foraminal narrowing at several levels of the mid to lower cervical spine. No acute fracture or traumatic subluxation. Emphysematous disease over the lung apices. Remainder of  the exam is within normal. IMPRESSION: No acute intracranial findings per Mild atrophy and chronic ischemic microvascular disease. No acute cervical spine injury. Mild to moderate spondylosis of the cervical spinal multilevel disc disease from the C4-5 level to the C6-7 level. Mild multilevel neural foraminal narrowing. Emphysematous disease. Electronically Signed   By: Elberta Fortis M.D.   On: 08/12/2015 14:16   Dg Chest Port 1 View  08/16/2015  CLINICAL DATA:  Short of breath EXAM: PORTABLE CHEST 1 VIEW COMPARISON:  08/12/2015 FINDINGS: Bilateral central and basilar haziness has developed consistent with in down bilateral pleural effusions and bibasilar volume loss. Normal heart size. Hyperaeration. No  pneumothorax. Right peripheral and lower lung zone nodular density is stable. IMPRESSION: Bilateral pleural effusions with bibasilar hypoaeration. Electronically Signed   By: Jolaine Click M.D.   On: 08/16/2015 07:00    Microbiology: Recent Results (from the past 240 hour(s))  Blood culture (routine x 2)     Status: None   Collection Time: 08/12/15  3:20 PM  Result Value Ref Range Status   Specimen Description BLOOD LEFT ANTECUBITAL  Final   Special Requests BOTTLES DRAWN AEROBIC AND ANAEROBIC 5 CC EA  Final   Culture   Final    NO GROWTH 5 DAYS Performed at [facility]    Report Status 08/17/2015 FINAL  Final  Blood culture (routine x 2)     Status: None   Collection Time: 08/12/15  3:20 PM  Result Value Ref Range Status   Specimen Description BLOOD LEFT WRIST  Final   Special Requests IN PEDIATRIC BOTTLE 5 CC  Final   Culture   Final    NO GROWTH 5 DAYS Performed at [facility]    Report Status 08/17/2015 FINAL  Final  Urine culture     Status: None   Collection Time: 08/12/15  4:55 PM  Result Value Ref Range Status   Specimen Description URINE, CLEAN CATCH  Final   Special Requests NONE  Final   Culture   Final    NO GROWTH 2 DAYS Performed at [facility]    Report Status 08/14/2015 FINAL  Final  MRSA PCR Screening     Status: None   Collection Time: 08/16/15  8:20 AM  Result Value Ref Range Status   MRSA by PCR NEGATIVE NEGATIVE Final    Comment:        The GeneXpert MRSA Assay (FDA approved for NASAL specimens only), is one component of a comprehensive MRSA colonization surveillance program. It is not intended to diagnose MRSA infection nor to guide or monitor treatment for MRSA infections.      Labs: Basic Metabolic Panel:  Recent Labs Lab 08/13/15 0507 08/14/15 0509 08/15/15 0540 08/16/15 0457 08/17/15 0306  NA 146* 146* 149* 154* 156*  K 3.9 4.3 4.2 4.1 4.2  CL 111 113* 115* 116* 118*  CO2 26 21* GLUCOSE 88 80  90 113* 95  BUN 34* 39* 49* 52* 55*  CREATININE 1.80* 1.93* 1.88* 1.76* 1.76*  CALCIUM 7.5* 7.8* 8.3* 8.6* 8.6*   Liver Function Tests: No results for input(s): AST, ALT, ALKPHOS, BILITOT, PROT, ALBUMIN in the last 168 hours. No results for input(s): LIPASE, AMYLASE in the last 168 hours.  Recent Labs Lab 08/15/15 1024  AMMONIA 12   CBC:  Recent Labs Lab 08/13/15 0507 08/14/15 0509 08/15/15 0540 08/16/15 0457 08/17/15 0306  WBC 23.5* 15.4* 18.5* 13.9* 11.8*  HGB 11.0* 11.5* 11.2* 12.0*  10.8*  HCT 33.1* 34.9* 34.3* 36.9* 32.7*  MCV 106.4* 107.1* 104.9* 107.9* 105.1*  PLT 110* 97* 106* 102* 77*   Cardiac Enzymes:  Recent Labs Lab 08/13/15 0507  CKTOTAL 919*   BNP: BNP (last 3 results) No results for input(s): BNP in the last 8760 hours.  ProBNP (last 3 results) No results for input(s): PROBNP in the last 8760 hours.  CBG:  Recent Labs Lab 08/14/15 0748 08/14/15 1131 08/14/15 1701  GLUCAP 79 89 83       Signed:  Hollye Pritt MD.  [facility] 08/19/2015, 1:44 PM

## 2015-08-30 DEATH — deceased

## 2018-03-29 IMAGING — CT CT CERVICAL SPINE W/O CM
3 of 6 series · 9 of 33 positions shown, 10 images · non-contrast
Comparison: None.

CLINICAL DATA: 84-year-old post fall three days ago while getting
out of chair.

EXAM:
CT HEAD WITHOUT CONTRAST
CT CERVICAL SPINE WITHOUT CONTRAST
TECHNIQUE: Multidetector CT imaging of the head and cervical spine was
performed following the standard protocol without intravenous
contrast. Multiplanar CT image reconstructions of the cervical spine
were also generated.

[Series 8: axial recon · axial · 0.17mm/px · z∈[-270,-270]mm · 1 of 94 slices shown, 2 images]
[im 47/94  soft-tissue]
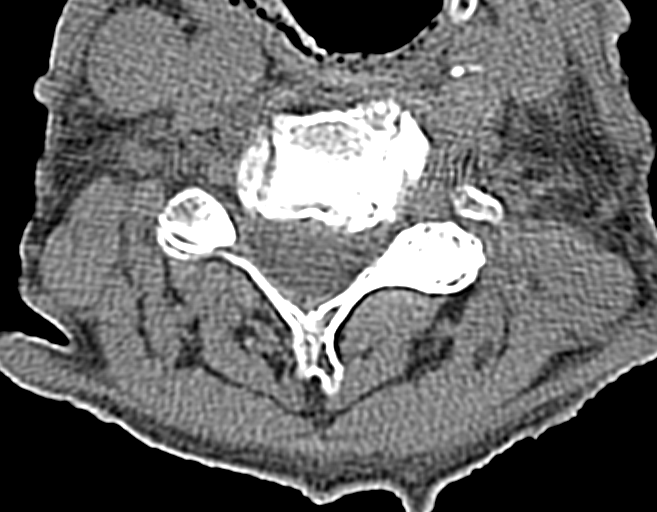
[im 47/94  bone]
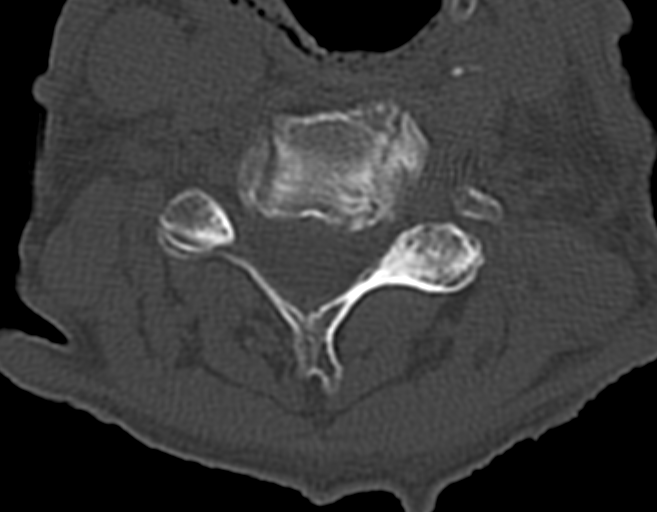

[Series 9: coronal · coronal · 0.25mm/px · 3 of 49 slices shown]
[im 10/49  bone]
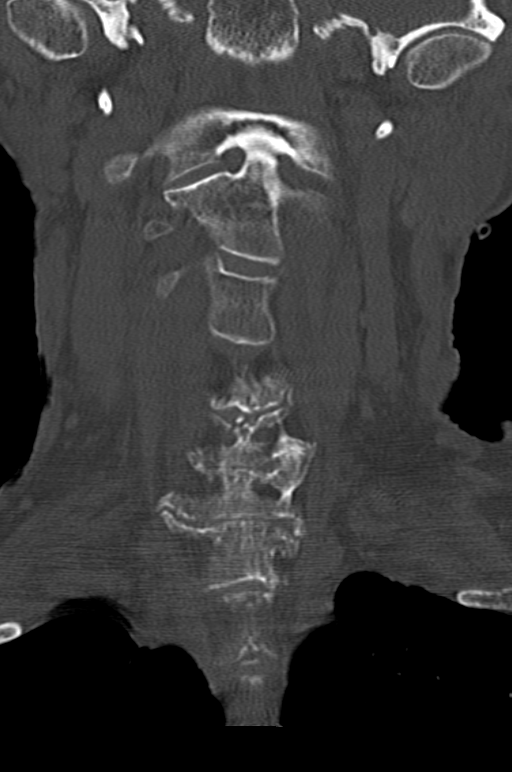
[im 20/49  bone]
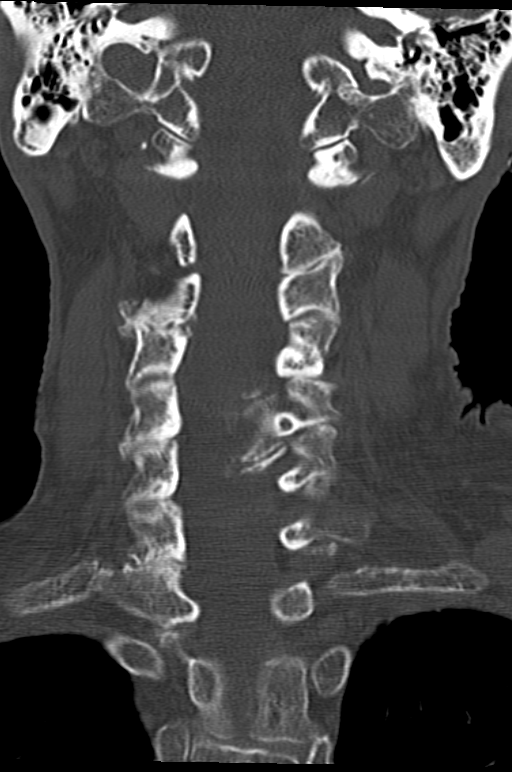
[im 29/49  bone]
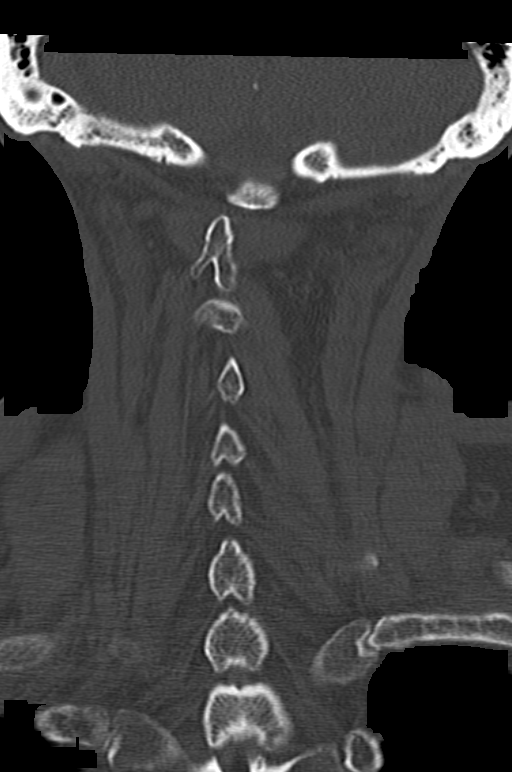

[Series 10: sagittal · sagittal · 0.17mm/px · 5 of 61 slices shown]
[im 21/61  bone]
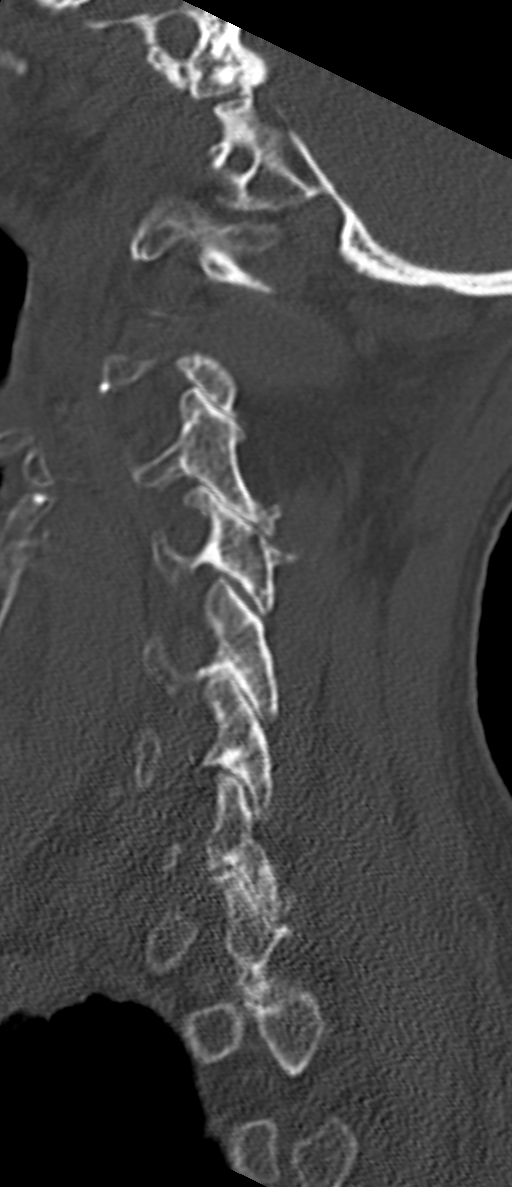
[im 26/61  bone]
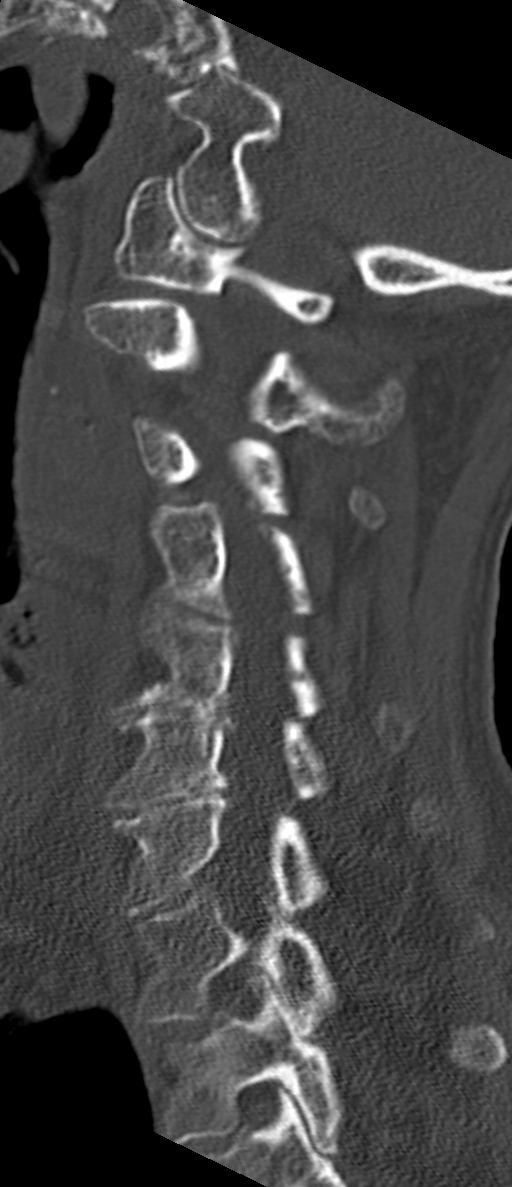
[im 31/61  bone]
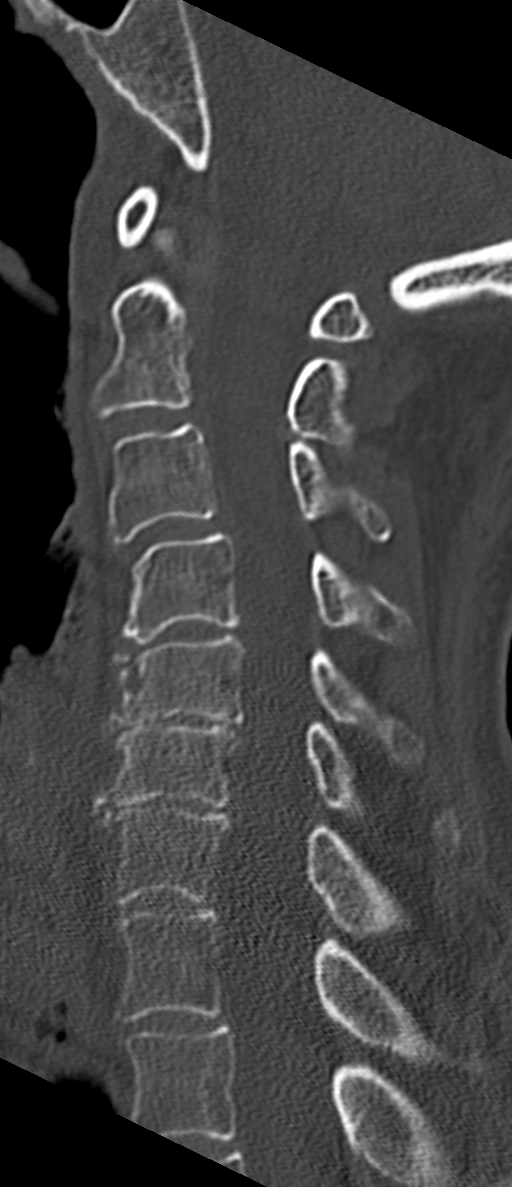
[im 36/61  bone]
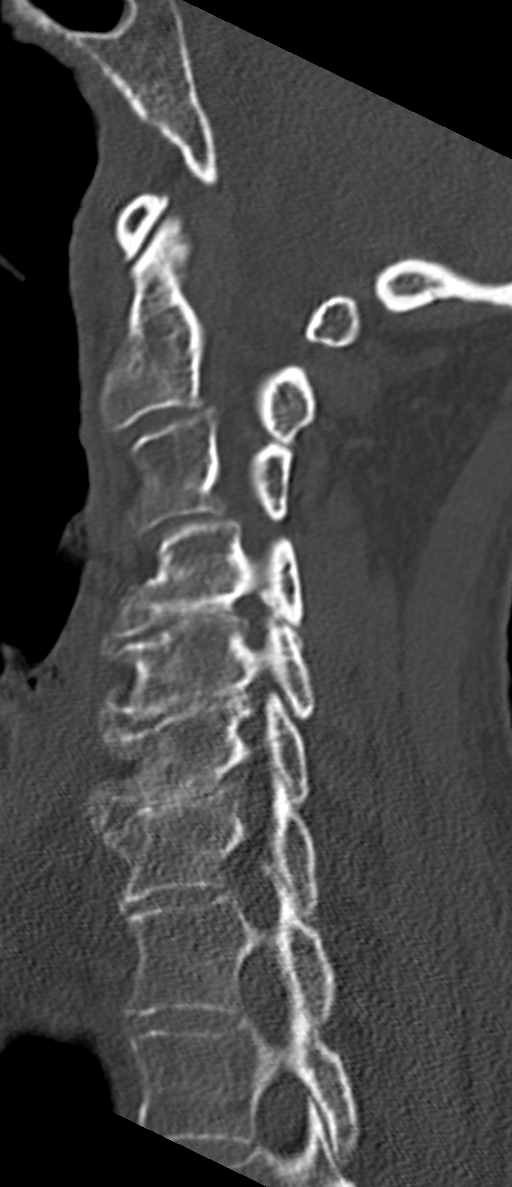
[im 41/61  bone]
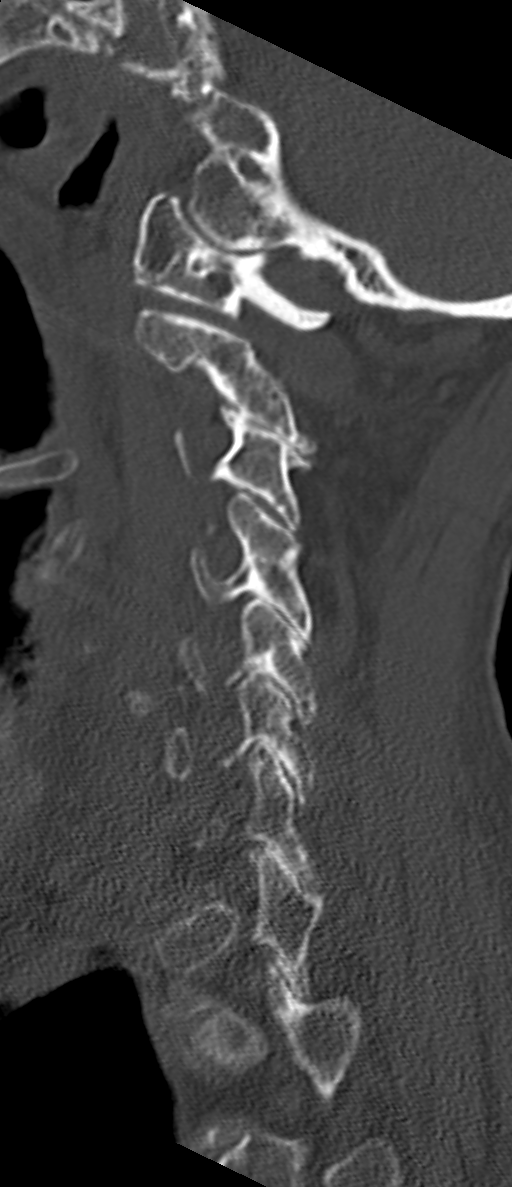

[9 of 33 positions shown; findings below may reference images not displayed]

FINDINGS: CT HEAD FINDINGS

Ventricles and cisterns are within normal. There is minimal
prominence of the CSF spaces compatible with age related atrophy.
There is chronic ischemic microvascular disease. There is no mass,
mass effect, shift of midline structures or acute hemorrhage. There
is no evidence to suggest acute infarction. Remaining bones and soft
tissues are within normal.

CT CERVICAL SPINE FINDINGS

There is straightening of the normal cervical lordosis with subtle
1-2 mm anterior subluxation of C3 on C4 likely degenerative. Mild to
moderate spondylosis of the cervical spine. There is moderate disc
space narrowing at the C5-6 and C6-7 levels and to lesser extent at
the C4-5 level. Prevertebral soft tissues are within normal. There
is moderate uncovertebral joint spurring and mild facet arthropathy.
The atlantoaxial articulation is within normal mild neural foraminal
narrowing at several levels of the mid to lower cervical spine. No
acute fracture or traumatic subluxation.

Emphysematous disease over the lung apices. Remainder of the exam is
within normal.
IMPRESSION: No acute intracranial findings per

Mild atrophy and chronic ischemic microvascular disease.

No acute cervical spine injury.

Mild to moderate spondylosis of the cervical spinal multilevel disc
disease from the C4-5 level to the C6-7 level. Mild multilevel
neural foraminal narrowing.

Emphysematous disease.
